# Patient Record
Sex: Female | Born: 1945
Health system: Southern US, Community
[De-identification: ages and names within clinical notes are randomized; demographics above are authoritative.]

## PROBLEM LIST (undated history)

## (undated) DIAGNOSIS — C801 Malignant (primary) neoplasm, unspecified: Secondary | ICD-10-CM

## (undated) DIAGNOSIS — Z923 Personal history of irradiation: Secondary | ICD-10-CM

## (undated) DIAGNOSIS — C50919 Malignant neoplasm of unspecified site of unspecified female breast: Secondary | ICD-10-CM

## (undated) HISTORY — PX: BREAST LUMPECTOMY: SHX2

## (undated) HISTORY — PX: JOINT REPLACEMENT: SHX530

---

## 1989-03-18 DIAGNOSIS — C801 Malignant (primary) neoplasm, unspecified: Secondary | ICD-10-CM

## 1989-03-18 HISTORY — DX: Malignant (primary) neoplasm, unspecified: C80.1

## 2000-07-04 ENCOUNTER — Other Ambulatory Visit: Admission: RE | Admit: 2000-07-04 | Discharge: 2000-07-04 | Payer: Self-pay | Admitting: Neurology

## 2000-07-15 ENCOUNTER — Other Ambulatory Visit: Admission: RE | Admit: 2000-07-15 | Discharge: 2000-07-15 | Payer: Self-pay | Admitting: Neurology

## 2001-06-22 ENCOUNTER — Ambulatory Visit (HOSPITAL_COMMUNITY): Admission: RE | Admit: 2001-06-22 | Discharge: 2001-06-22 | Payer: Self-pay | Admitting: Internal Medicine

## 2001-06-22 ENCOUNTER — Encounter: Payer: Self-pay | Admitting: Internal Medicine

## 2002-02-09 ENCOUNTER — Ambulatory Visit (HOSPITAL_BASED_OUTPATIENT_CLINIC_OR_DEPARTMENT_OTHER): Admission: RE | Admit: 2002-02-09 | Discharge: 2002-02-09 | Payer: Self-pay | Admitting: Orthopedic Surgery

## 2002-06-30 ENCOUNTER — Ambulatory Visit (HOSPITAL_COMMUNITY): Admission: RE | Admit: 2002-06-30 | Discharge: 2002-06-30 | Payer: Self-pay | Admitting: Internal Medicine

## 2002-06-30 ENCOUNTER — Encounter: Payer: Self-pay | Admitting: Internal Medicine

## 2003-07-04 ENCOUNTER — Ambulatory Visit (HOSPITAL_COMMUNITY): Admission: RE | Admit: 2003-07-04 | Discharge: 2003-07-04 | Payer: Self-pay | Admitting: Internal Medicine

## 2004-03-18 HISTORY — PX: FOOT SURGERY: SHX648

## 2004-07-05 ENCOUNTER — Ambulatory Visit (HOSPITAL_COMMUNITY): Admission: RE | Admit: 2004-07-05 | Discharge: 2004-07-05 | Payer: Self-pay | Admitting: Internal Medicine

## 2005-05-10 ENCOUNTER — Ambulatory Visit (HOSPITAL_BASED_OUTPATIENT_CLINIC_OR_DEPARTMENT_OTHER): Admission: RE | Admit: 2005-05-10 | Discharge: 2005-05-10 | Payer: Self-pay | Admitting: Specialist

## 2005-07-08 ENCOUNTER — Ambulatory Visit (HOSPITAL_COMMUNITY): Admission: RE | Admit: 2005-07-08 | Discharge: 2005-07-08 | Payer: Self-pay | Admitting: Internal Medicine

## 2005-10-04 ENCOUNTER — Inpatient Hospital Stay (HOSPITAL_COMMUNITY): Admission: RE | Admit: 2005-10-04 | Discharge: 2005-10-08 | Payer: Self-pay | Admitting: Specialist

## 2006-01-20 ENCOUNTER — Inpatient Hospital Stay (HOSPITAL_COMMUNITY): Admission: RE | Admit: 2006-01-20 | Discharge: 2006-01-24 | Payer: Self-pay | Admitting: Specialist

## 2006-07-14 ENCOUNTER — Ambulatory Visit (HOSPITAL_COMMUNITY): Admission: RE | Admit: 2006-07-14 | Discharge: 2006-07-14 | Payer: Self-pay | Admitting: Internal Medicine

## 2007-07-15 ENCOUNTER — Ambulatory Visit (HOSPITAL_COMMUNITY): Admission: RE | Admit: 2007-07-15 | Discharge: 2007-07-15 | Payer: Self-pay | Admitting: Internal Medicine

## 2008-01-16 ENCOUNTER — Emergency Department (HOSPITAL_COMMUNITY): Admission: AC | Admit: 2008-01-16 | Discharge: 2008-01-16 | Payer: Self-pay | Admitting: Emergency Medicine

## 2008-03-03 ENCOUNTER — Ambulatory Visit (HOSPITAL_COMMUNITY): Admission: RE | Admit: 2008-03-03 | Discharge: 2008-03-03 | Payer: Self-pay | Admitting: Family Medicine

## 2008-07-14 ENCOUNTER — Ambulatory Visit (HOSPITAL_COMMUNITY): Admission: RE | Admit: 2008-07-14 | Discharge: 2008-07-14 | Payer: Self-pay | Admitting: Internal Medicine

## 2009-07-17 ENCOUNTER — Ambulatory Visit (HOSPITAL_COMMUNITY): Admission: RE | Admit: 2009-07-17 | Discharge: 2009-07-17 | Payer: Self-pay | Admitting: Internal Medicine

## 2010-06-25 ENCOUNTER — Other Ambulatory Visit (HOSPITAL_COMMUNITY): Payer: Self-pay | Admitting: Internal Medicine

## 2010-06-25 DIAGNOSIS — Z139 Encounter for screening, unspecified: Secondary | ICD-10-CM

## 2010-07-20 ENCOUNTER — Ambulatory Visit (HOSPITAL_COMMUNITY)
Admission: RE | Admit: 2010-07-20 | Discharge: 2010-07-20 | Disposition: A | Discharge status: Home / Self Care | Payer: Medicare Other | Source: Ambulatory Visit | Attending: Internal Medicine | Admitting: Internal Medicine

## 2010-07-20 DIAGNOSIS — Z1231 Encounter for screening mammogram for malignant neoplasm of breast: Secondary | ICD-10-CM | POA: Insufficient documentation

## 2010-07-20 DIAGNOSIS — Z139 Encounter for screening, unspecified: Secondary | ICD-10-CM

## 2010-08-03 NOTE — H&P (Signed)
Paige, Freeman                 ACCOUNT NO.:  [id number]   MEDICAL RECORD NO.:  [id number]          PATIENT TYPE:  INP   LOCATION:  NA                           FACILITY:  [facility]   PHYSICIAN:  Jene Every, M.D.    DATE OF BIRTH:  11-23-1945   DATE OF ADMISSION:  10/04/2005  DATE OF DISCHARGE:                                HISTORY & PHYSICAL   CHIEF COMPLAINT:  Left knee pain.   HISTORY:  [name] is a pleasant 65 year old female with longstanding  history of bilateral knee pain, left being greater than right.  She has  undergone arthroscopy as well as multiple cortisone injections with minimal  relief of her symptoms.  She does state her pain is fairly disabling.  She  notes pain along the medial joint line as well as pain with patellofemoral  compression.  Radiographs dyspnea on exertion reveal end-stage medial  compartment narrowing bilaterally with patellofemoral arthritis as well.  It  is felt at this point that the patient will benefit from a knee  arthroplasty.  The risks and benefits of the surgery were discussed with the  patient.  She did receive medical clearance.  Her surgery was scheduled.   CURRENT MEDICATIONS:  1.  Hydrochlorothiazide 25 mg half p.o. daily.  2.  Lipitor 10 mg one p.o. daily.  3.  Prilosec p.r.n.  4.  Lodine 400 mg one p.o. b.i.d.   ALLERGIES:  NO KNOWN DRUG ALLERGIES.   PAST MEDICAL HISTORY:  1.  Hypertension.  2.  Hypercholesterolemia.  3.  Gastroesophageal reflux disease.   PAST SURGICAL HISTORY:  1.  Previous lumpectomy for breast cancer on the left in 1991.  2.  Right foot surgery in 2003.  3.  Right knee arthroscopy in 2007.   SOCIAL HISTORY:  Patient is married.  She is a housewife.  She denies any  tobacco or alcohol consumption.  Husband will be her primary caregiver.  She  lives in a [place] home.  Her primary care physician is Dr. Sherwood Gambler.   FAMILY HISTORY:  Noncontributory.   REVIEW OF SYSTEMS:  GENERAL:  The patient  denies any fevers, chills, night  sweats or bleeding tendencies.  CNS:  No blurry, double vision, seizure,  headache or paralysis.  RESPIRATORY:  No shortness of breath, productive  cough or hemoptysis.  CARDIOVASCULAR:  No chest pain, dyspnea or orthopnea.  GU:  No dysuria, hematuria or discharge.  GI:  No nausea, vomiting,  diarrhea, constipation, melena or bloody stools.  MUSCULOSKELETAL:  Pertinent as in HPI.   PHYSICAL EXAMINATION:  GENERAL APPEARANCE:  This is a well-developed, well-  nourished female sitting upright in no acute distress.  VITAL SIGNS:  Pulses 110, respiratory 20, blood pressure 122/90.  HEENT:  Atraumatic and normocephalic.  Pupils are equal, round and reactive  to light.  EOM intact.  NECK:  Supple with no lymphadenopathy.  CHEST:  Clear to auscultation bilaterally, no wheezing, rhonchi or rales.  CARDIOVASCULAR:  Patient does have tachycardia but is regular.  No murmurs,  rubs, or gallops are noted.  ABDOMEN:  Soft  and nontender, nondistended, bowel sounds x4.  SKIN:  No rashes or lesions are noted.  EXTREMITIES:  Regarding the left knee, patient does have mild effusion  noted.  She is tender along the medial joint line.  There is pain with  patellofemoral compression.  Calf soft and nontender with no evidence of  DVT.   IMPRESSION:  Degenerative joint disease left knee.   PLAN:  Patient will be admitted to [facility] to undergo a left  total knee arthroplasty.      Roma Schanz, P.A.      Jene Every, M.D.  Electronically Signed    CS/MEDQ  D:  10/03/2005  T:  10/03/2005  Job:  [id number]

## 2010-08-03 NOTE — Discharge Summary (Signed)
[name], [name]                 ACCOUNT NO.:  [id number]   MEDICAL RECORD NO.:  [id number]          PATIENT TYPE:  INP   LOCATION:  1504                         FACILITY:  [facility]   PHYSICIAN:  Jene Every, M.D.    DATE OF BIRTH:  Feb 27, 1946   DATE OF ADMISSION:  10/04/2005  DATE OF DISCHARGE:  10/08/2005                                 DISCHARGE SUMMARY   ADMISSION DIAGNOSES:  1. Degenerative joint disease, left knee.  2. Hypertension.  3. Hypercholesterolemia.  4. Gastroesophageal reflux disease.   DISCHARGE DIAGNOSES:  1. Degenerative joint disease, left knee.  2. Hypertension.  3. Hypercholesterolemia.  4. Gastroesophageal reflux disease.  5. Status post left total knee arthroplasty.  6. Resolved hypokalemia.   HISTORY:  Ms. Parrillo is a pleasant 65 year old female with a longstanding  history of disabling knee pain.  She has undergone arthroscopic debridement  as well as multiple cortisone injections with no relief of her symptoms.  She describes her pain pattern as being fairly disabling.  She has  significant tearing along the medial joint line as well as patellofemoral  compression.  Radiographs do reveal end stage medial compartment narrowing  with patellofemoral changes noted as well.  It was felt at this point that  the patient would benefit from a total knee arthroplasty.  Risks and  benefits were discussed and she did wish to proceed.   CONSULTATIONS:  Physical therapy, Occupational therapy, Case Management.   PROCEDURE:  The patient was taken to the operating room on October 04, 2005 to  undergo a left total knee arthroplasty.  Surgeon was Jene Every, M.D. and  assistant was Roma Schanz, P.A.  Anesthesia was general.  Complications none.   LABORATORY DATA:  Preoperative CBC showed a white blood cell count  approximately 2, hemoglobin 13.8, hematocrit 39.9.  These were followed  throughout the hospital course.  White blood cell count became elevated  on  postoperative day #2 at 12.0.  At time of discharge resolved to 5.9.  Hemoglobin dropped a low of 9.6, hematocrit 27.8, had normalized at time of  discharge to 9.8 and 27.9.  Coagulation studies done preoperatively showed a  PT of 12.5, INR 0.9, PTT of 24.  Patient was placed on Coumadin  postoperatively.  These levels were continued to be followed.  At time of  discharge she was therapeutic on her Coumadin with an INR of 2.6.  Routine  chemistry studies done preoperatively showed sodium 140, potassium 3.8,  elevated glucose 103.  These were followed throughout the hospital course.  The patient's sodium remained normal.  At time of discharge 137.  Potassium  did drop to a low of 3.1, p.o. supplement was implemented.  At time of  discharge potassium was 3.4.  Preop urinalysis showed small leukocyte  esterase, with 0 to 2 white blood cells seen per high power field.  Blood  type B positive.  Chest x-ray shows no active cardiopulmonary disease.  I do  not see a EKG on the chart.   HOSPITAL COURSE:  The patient was admitted and taken to the operating  room  and underwent the above stated procedure without difficulty.  She was then  transferred to the PACU and then to the orthopedic floor for postoperative  care.  Intraoperatively, one Hemovac drain was placed.  Postoperatively the  patient was placed on Coumadin for deep venous thrombosis prophylaxis,  placed on PCA analgesics.  Patient did fairly well postoperatively.  Postoperative day 1 Hemovac was removed without difficulty.  Day #2 dressing  was changed.  Incision was clean, dry and intact.  Compartments were soft.  Coumadin was continued per pharmacy.  Postoperative day #3 patient's INR was  slightly high.  Coumadin was held.  She had a slightly decreased potassium.  This was supplemented with p.o. K-Dur.  Discharge planning was initiated.  Postoperative day #4 it was felt at this point that the patient was stable  to be discharged  home.  Potassium has improved to a level of 3.4.  Coumadin  was restarted.  While she was advancing with physical therapy, home health  physical therapy, occupational therapy had been arranged.   DISPOSITION:  Patient stable to be discharged home with home health physical  therapy and occupational therapy.   PLAN:  Patient to follow up with Dr. Shelle Iron in approximately 10 to 14 days  for staple removal.   ACTIVITY:  She can ambulate as tolerated.  Continue to use knee immobilizer  until she can straight leg raise x 10.   WOUND CARE:  Change dressing daily.  Advise [place] if any erythema or drainage  develop.  Okay for her to shower.   DIET:  Is as tolerated.   DISCHARGE MEDICATIONS:  Resume all home medications.  Vicodin 1 to 2 by  mouth every 4 to 6 hours as needed for pain.  Coumadin as dosed per the  pharmacy.   CONDITION ON DISCHARGE:  Stable.   FINAL DIAGNOSIS:  Doing well status post left total knee arthroplasty.      Roma Schanz, P.A.      Jene Every, M.D.  Electronically Signed    CS/MEDQ  D:  11/06/2005  T:  11/06/2005  Job:  [id number]

## 2010-08-03 NOTE — Op Note (Signed)
[name], [name]                             ACCOUNT NO.:  [id number]   MEDICAL RECORD NO.:  [id number]                   PATIENT TYPE:  AMB   LOCATION:  DSC                                  FACILITY:  [facility]   PHYSICIAN:  Leonides Grills, M.D.                  DATE OF BIRTH:  11/14/1945   DATE OF PROCEDURE:  02/09/2002  DATE OF DISCHARGE:                                 OPERATIVE REPORT   PREOPERATIVE DIAGNOSES:  1. Right second tarsometatarsal arthritis.  2. Right anterolateral ankle soft tissue lesion, deep.   POSTOPERATIVE DIAGNOSES:  1. Right second tarsometatarsal arthritis.  2. Right anterolateral ankle soft tissue lesion, deep.   PROCEDURE:  1. Right second TMT fusion; right local bone graft.  2. Excision right anterolateral ankle deep soft tissue lesion.   ANESTHESIA:  General endotracheal tube.   SURGEON:  Leonides Grills, M.D.   ASSISTANT:  Lianne Cure, P.A.   ESTIMATED BLOOD LOSS:  Minimal.   TOURNIQUET TIME:  One hour.   COMPLICATIONS:  None.   DISPOSITION:  Stable to the PR.   FINDINGS:  Soft tissue lesion that was consistent with ganglion cyst.   INDICATIONS FOR PROCEDURE:  This is a 65 year old female who has had  longstanding dorsal pain over the second TMT area with a spur as well as an  intermittently growing cyst on the anterolateral aspect of the ankle.  MRI  was consistent with a ganglion cyst was well as isolated arthritis of the  second tarsal metatarsal joint.  She was consented for the above procedure.  All risks which include infection, nerve vessel injury, nonunion, malunion,  hardware failure, recurrence of the lesion anterolaterally about the ankle,  stiffness, and arthritis were all explained.  Questions were encouraged and  answered.   DESCRIPTION OF PROCEDURE:  The patient was brought to the operating room and  placed in the supine position.  After adequate general endotracheal tube  anesthesia was administered as well as the Ancef  1 gram IV piggyback.  The  right lower extremity was prepped and draped in the usual sterile fashion  with a proximally placed thigh tourniquet, we started the procedure with a  longitudinal incision over the anterolateral aspect of the ankle.  Dissection was carried down to the soft tissue lesion.  This was well  encapsulated and was dissected out to its stalk which was traced to the  sinus tarsi area.  Once this was resected, the wound was copiously irrigated  with normal saline. The subcu was closed with 3-0 Vicryl.  The skin was  closed with 4-0 nylon. We then made a longitudinal incision lateral to the  EHL tendon, dissection was carried down through subcu.  Dorsalis pedis was  identified and protected laterally. Throughout the remaining portion of the  procedure.  The second tarsal metatarsal joint was then identified both  visually and under C-arm  guidance.  This was severely arthritic and had a  large dorsal spur which was excised with a curved 1/4 inch osteotome.  There  was also cystic fluid in this area as well which was removed. With the aid a  laminar spreader, the remaining portion of the cartilage was then removed.  Multiple 2 mm drill holes were then placed on either side of the joint for  iatrogenic vascular channels.  We then obtaining local bone graft and placed  this within the defect dorsally.  We then reduced this with a two-point  reduction clamp.  Once this was held compressed, we then placed a Lis-franc  directed lag screw across this area to maintain the reduction and  compression.  Stress spring relieving local bone graft was then placed in  the dorsal aspect of the wound.  Screw size was a 32 mm fully threaded  cortical lag screw placed in the standard AO technique.  This was done  through a stab wound medially.  The wound was copiously irrigated with  normal saline.  Final x-rays in the AP, lateral, and oblique planes were  obtained and show excellent alignment  and compression across the site.  Tourniquet was deflated with palpable dorsalis pedis pulse.  The wounds were  closed with 4-0 nylon.  Sterile dressing was applied.  Modified Jones  dressing was applied to the ankle in neutral dorsiflexion. The patient was  stable to the PR.                                               Leonides Grills, M.D.    PB/MEDQ  D:  02/09/2002  T:  02/09/2002  Job:  [id number]

## 2010-08-03 NOTE — Op Note (Signed)
NAMESKYLAR, [name]                 ACCOUNT NO.:  [id number]   MEDICAL RECORD NO.:  [id number]          PATIENT TYPE:  AMB   LOCATION:  NESC                         FACILITY:  [facility]   PHYSICIAN:  Jene Every, M.D.    DATE OF BIRTH:  Jun 26, 1945   DATE OF PROCEDURE:  05/10/2005  DATE OF DISCHARGE:                                 OPERATIVE REPORT   PREOPERATIVE DIAGNOSIS:  Medial meniscal tear, degenerative joint disease,  right knee.   POSTOPERATIVE DIAGNOSIS:  Grade 3 chondromalacia of the patella, grade 4  chondromalacia and grade 3 chondromalacia, medial compartment, medial  meniscal tear, lateral meniscal tear.   PROCEDURE PERFORMED:  Right knee arthroscopy, chondroplasty of the patella,  medial femoral condyle, posterior medial meniscectomy, lateral meniscectomy.   BRIEF HISTORY/INDICATIONS:  A 65 year old with refractory knee pain.  MRI  and x-ray indicating degenerative changes, locking giving way.  Felt to have  a degenerative meniscal tear as well as DJD.  Operative intervention was  indicated for debridement and partial meniscectomy.  Discussed risks and  benefits, including bleeding, infection, damage to vascular structures, DVT,  PE, anesthetic complications, no changes in symptoms, worsening symptoms,  the need for total knee arthroplasty in the future, etc.   TECHNIQUE:  With the patient in a supine position, after the induction of  adequate general anesthesia and 1 gm of Kefzol, the right lower extremity  was prepped and draped in the usual sterile fashion.  A lateral parapatellar  portal and a superomedial parapatellar portal was fashioned with a #11  blade.  Ingress cannula atraumatically placed.  The irrigant was utilized to  insufflate the joint.  The camera was then inserted.  Under direct  visualization, a medial parapatellar portal was fashioned with a #11 blade  after localization with an 18 gauge needle, sparing the medial meniscus.  A  shaver was  introduced and utilized to perform a chondroplasty of the  patella, where grade 3 chondromalacia was noted.  There was normal  patellofemoral tracking.  The medial compartment revealed some grade 4  changes, moderate, over the medial femoral condyles and mild in the tibial  plateau.  There was a complex tear of the posterior third of the meniscus  extending halfway posteriorly.  The basket rongeur was utilized to perform a  posterior medial meniscectomy with a stable base.  Further contoured with a  4.2 coude shaver.  ACL and PCL were unremarkable.  The lateral meniscus  demonstrated a radial tear.  This was excised with a basket rongeur and  contoured to a stable base with 4.2 coude shaver.  Both remaining meniscal  remnants were stable to probe palpation without evidence of pathologic  tears.  Chondroplasty of the femoral condyle with grade 3 changes were  performed.  We evacuated loose bodies.  The gutters were unremarkable.  Good  range of motion.  Fully lavaged of the knee.  Reinspected all compartments.  No loose cartilaginous debris was noted.  Palpated posteriorly on the  popliteal area.  Expressed no free cartilage.  The knee was copiously  lavaged.  All instrumentation  was removed.  Portals were  closed with 4-0 nylon simple sutures, and 0.25% Marcaine with epinephrine  was infiltrated in the joint.  The wound was dressed sterilely.  She was  awakened without difficulty and transported to the recovery room in  satisfactory condition.  The patient tolerated the procedure well with no  complication.      Jene Every, M.D.  Electronically Signed     JB/MEDQ  D:  05/10/2005  T:  05/11/2005  Job:  [id number]

## 2010-08-03 NOTE — Op Note (Signed)
[name], [name]                 ACCOUNT NO.:  [id number]   MEDICAL RECORD NO.:  [id number]          PATIENT TYPE:  INP   LOCATION:  1504                         FAC[facility]   PHYSICIAN:  Jene Every, M.D.    DATE OF BIRTH:  06/03/1945   DATE OF PROCEDURE:  10/04/2005  DATE OF DISCHARGE:                                 OPERATIVE REPORT   PREOPERATIVE DIAGNOSIS:  Degenerative joint disease, left knee.   POSTOPERATIVE DIAGNOSIS:  Degenerative joint disease, left knee.   PROCEDURE:  Left total knee arthroplasty.   ANESTHESIA:  General.   ASSISTANT:  Roma Schanz, P.A.   COMPONENTS:  DePuy rotating platform, 3 femur, 3 tibia, 10 mm insert, 35  dome patella.   BRIEF HISTORY:  A 65 year old with end-stage osteoarthrosis of the knee  particularly the medial compartment refractory to conservative treatment.  Operative intervention is indicated for replacement of degenerated joint.  The risks and benefits were discussed including bleeding, infection, injury  to neurovascular structures, suboptimal range of motion, component failure,  DVT, PE, anesthetic complications, infection, need for revision, etc.   TECHNIQUE:  The patient in the supine position and after the induction of  adequate general anesthesia and 1 gram of Kefzol, the left lower extremity  was prepped and draped and exsanguinated in the usual sterile fashion and  thigh tourniquet was inflated to 300 mmHg. A midline incision was made in  the skin, subcutaneous tissue was dissected, electrocautery was utilized to  achieve hemostasis. A median parapatellar arthrotomy was performed, patella  everted, knee flexed, patellofemoral ligament was divided, copious clear  synovial fluid was evacuated. Subperiosteally elevated, medial aspect of the  superficial portion of the medial collateral ligament. I removed osteophytes  with a rongeur, there was tricompartmental osteoarthrosis. A step drill was  utilized to enter  the femoral canal. Over reamed, irrigated and  intramedullary guide 5 degree left was then placed, pinned. 10 mm off the  distal femur was a cut with soft tissues well-protected. The femur was sized  to a 3, distal femoral cutting jig was then applied, the anterior cortex was  measured with a 3 being the size. The jig was pinned in slight external  rotation 3 degrees. The jig was then placed, anterior, posterior and chamfer  cuts performed with an oscillating saw. The posterior elements and soft  tissue protected at all times. Attention next turned towards the tibia. The  tibial spine was then removed, measured a 3 plate. We placed the external  jig on the tibia, __________ were placed posteriorly and laterally. This was  very carefully. External alignment jig parallel to the tibia for a 90 degree  zero cut. 10 mm off the high side which was lateral, this was then pinned  just medial to the tibial tubercle with adequate coverage of the tibia. We  then used the oscillating saw to perform the tibial cut. This was excellent,  the soft tissue was well protected. I then removed this jig, placed him in  extension and checked the flexion extension gap with a 10 mm block guide  which was excellent in extension. We removed the shin for flexion. Good  stability was noted. The wound was copiously irrigated, examined posteriorly  and removed osteophytes. Next, the knee was then flexed. We placed a distal  box jig to the femur, clamped it. We used an oscillating saw to fashion the  box cut. We then placed a trial 3 femur. The baseplate was then placed  through the tibia, a step reamer applied and then we used a punch guide to  the tibia, placed a trial tibia with an insert, reduced the femur to the  tibia, found to be in full extension with perhaps slight hyperextension.  Full flexion 90 degrees without liftoff and full flexion to 130. No evidence  of __________  0-30 degrees. I felt this was the  appropriate sizing with a  10 mm insert. All instrumentation was then removed, the patella was everted,  osteophytes removed with the rongeur. We used a patella planer with at least  12-13 mm of patella thickness. We used a planer and then the peg guide sized  to a 35. Next, the pulsatile lavage was then delivered into the wound. The  cement was mixed on the posterior table in appropriate fashion. After  irrigation and drying of the wound, the tibia was flexed, subluxed, dried,  cement placed in the proximal tibia. With the trials in, we had trialed it  to a 10 mm insert. The tibial component was impacted, redundant cement was  removed. The femur was then impacted, redundant cement removed. The trial 10  was placed, the knee was reduced, held in extension with an axial load  applied, redundant cement removed. This was until the appropriate cement  curing. The patella button was then cemented as well with the patella clamp.  After appropriately curing the cement, the insert was removed as were the  clamps. We inspected the joint and removed redundant cement. We copiously  irrigated. We cauterized the geniculates. Again the 10 was found to be  appropriate at full extension, full flexion, negative anterior drawer with  towel clips on the patella ligament. No instability. We selected the 10 and  placed the 10 insert after copious irrigation, again full flexion, full  extension, no liftoff, good patellofemoral tracking. No need for lateral  retinacular release. Again the wound copiously irrigated. Inspection  revealed no evidence of loose cement. The knee was placed in 30 degrees of  flexion, repaired the patella after placing a Hemovac, bringing it out  through a lateral stab wound in the skin. We repaired the patella ligament  with #1 Vicryl interrupted figure-of-eight sutures. Subcutaneous tissue  reapproximated with 2-0 Vicryl simple suture. The skin was reapproximated with staples. We then  placed 4% Marcaine with epinephrine into the joint.  The wound was then dressed sterilely just prior to that, full extension and  flexion holding the thigh to at least 110 degrees.   Next, a sterile dressing applied. The tourniquet was deflated and there was  adequate vascularization of the lower extremity.   The patient tolerated the procedure well with no complications. Tourniquet  time was an hour and a half. Assistant Roma Schanz.      Jene Every, M.D.  Electronically Signed     JB/MEDQ  D:  10/04/2005  T:  10/04/2005  Job:  [id number]

## 2010-08-03 NOTE — H&P (Signed)
[name], [name]                 ACCOUNT NO.:  [id number]   MEDICAL RECORD NO.:  [id number]          PATIENT TYPE:  INP   LOCATION:  NA                           FACILITY:  [facility]   PHYSICIAN:  Jene Every, M.D.    DATE OF BIRTH:  [birth year]   DATE OF ADMISSION:  01/13/2006  DATE OF DISCHARGE:                                HISTORY & PHYSICAL   CHIEF COMPLAINT:  Right knee pain.   HISTORY:  [name] is a pleasant 65 year old female with longstanding  history of bilateral knee pain.  She has recently undergone a left total  knee replacement and has done quite well from this.  She is now ready to  proceed with a right total knee.  Her pain on this side is fairly disabling.  Radiographs do show end-stage medial compartment narrowing as well as  patellar femoral arthritis.  It is felt she would benefit from a total knee  on the right.  The risks and benefits of this surgery were discussed with  the patient.  She elected to proceed.   MEDICAL HISTORY:  1. Significant for hypertension.  2. Hypercholesterolemia.  3. Gastroesophageal reflux disease.   CURRENT MEDICATIONS:  1. HCTZ 25 mg 1 p.o. daily.  2. Lipitor 10 mg 1 p.o. daily.  3. Prilosec p.r.n.  4. Lodine 400 mg 1 p.o. b.i.d.  5. Robaxin 1 p.o. b.i.d. p.r.n.   ALLERGIES:  None.   PAST SURGICAL HISTORY:  1. Include previous lumpectomy for breast cancer on the left in 1991.  2. Right foot surgery in 2003.  3. Right knee arthroscopy in 2007.  4. Left total knee 2007.   SOCIAL HISTORY:  The patient is married.  She is a housewife.  She denies  any tobacco or alcohol consumption.  Husband will be her primary caregiver.  She lives in a one story home.  Her primary care physician is Dr. Sherwood Gambler.   FAMILY HISTORY:  Noncontributory.   REVIEW OF SYSTEMS:  GENERAL:  The patient denies any fever, chills, night  sweats or bleeding tendency.  CNS:  No blurred or double vision, seizure,  headaches or paralysis.  RESPIRATORY:  No  shortness of breath, productive  cough or hemoptysis.  CARDIOVASCULAR:  No chest pain, angina or orthopnea.  GU:  No dysuria, hematuria or discharge.  GI:  No nausea, vomiting,  diarrhea, constipation, melena or bloody stools.  MUSCULOSKELETAL:  Pertinent as per HPI.   PHYSICAL EXAMINATION:  A well-developed, well-nourished female seen upright  in no acute distress.  She did develop a slight antalgic gait.  BP is  134/100 right arm.  The patient did not take her morning medications.  Pulse  is 100, respiratory rate 20.  HEENT:  Normocephalic and atraumatic.  Pupils are equal, round, and reactive  to light.  EOMs intact.  NECK:  Supple.  No lymphadenopathy.  CHEST:  Clear to auscultation bilaterally.  No rhonchi, wheezes or rales.  No murmurs, gallops, or rubs.  ABDOMEN:  Soft, nontender, and nondistended.  Bowel sounds are full.  SKIN:  No rashes or lesions  are noted.  EXTREMITIES:  The patient does have mild effusion noted on the right.  She  is exquisitely tender along the medial joint line.  There is pain with  compression.  Calf soft and nontender.   IMPRESSION:  Degenerative joint disease right knee.   PLAN:  The patient will be admitted to [facility] to undergo a  right total knee arthroplasty.      Roma Schanz, P.A.      Jene Every, M.D.  Electronically Signed    CS/MEDQ  D:  01/13/2006  T:  01/13/2006  Job:  [id number]

## 2010-08-03 NOTE — Discharge Summary (Signed)
[name], [name]                 ACCOUNT NO.:  [id number]   MEDICAL RECORD NO.:  [id number]          PATIENT TYPE:  INP   LOCATION:  1519                         FACILITY:  [facility]   PHYSICIAN:  Jene Every, M.D.    DATE OF BIRTH:  04-Jan-1946   DATE OF ADMISSION:  01/20/2006  DATE OF DISCHARGE:  01/24/2006                               DISCHARGE SUMMARY   ADMISSION DIAGNOSES:  1. Degenerative joint disease right knee.  2. Hypertension.  3. Hypercholesterolemia.  4. Gastroesophageal reflux disease.   DISCHARGE DIAGNOSES:  1. Degenerative joint disease right knee.  2. Hypertension.  3. Hypercholesterolemia.  4. Gastroesophageal reflux disease.  5. Status post right total knee arthroplasty.   CONSULTATIONS:  1. Physical therapy.  2. Occupational therapy.  3. Case management.   PROCEDURE:  The patient was taken to the OR on January 20, 2006 to  undergo right total knee arthroplasty.   SURGEON:  Jene Every, M.D.   ASSISTANT:  Roma Schanz, [facility]   ANESTHESIA:  General.   COMPLICATIONS:  None.   HISTORY OF PRESENT ILLNESS:  [name] is a pleasant 65 year old female  with a long standing history of bilateral knee pain. She has undergone a  left total knee arthroplasty back in the spring with excellent relief of  her symptoms. She has failed conservative treatment with the right, so  it was felt that she would benefit from a total knee on the right as  well. The risks and benefits were discussed with the patient and she  does elect to proceed.   LABORATORY DATA:  Preoperative CBC showed a white cell count of 5.8.  Hemoglobin and hematocrit 14.3 and 41.2. These were followed throughout  the hospital course. White cell count remained normal. White count at  time of discharge, 4.7. Hemoglobin did drop to a low of 9.6. Hematocrit  27.2. However, the patient remained asymptomatic. Coagulation studies  done preoperatively showed a PT of 12.1, INR of 0.9. These were  monitored throughout the hospital course, secondary to placing her on  Coumadin. At time of discharge, she was placed therapeutic on her INR to  a low of 1.9. Routine chemistries done preoperatively showed sodium of  140, potassium 3.2, glucose of 109 with a normal BUN and creatinine. The  patient did have a slight drop in her sodium to a low of 133, however,  this corrected itself at the time of discharge to a low of 140.  Potassium did improve to a low of 3.4. Glucose stabilized at 97 with  continued normal BUN and creatinine. Pre-admission urinalysis was  positive for mild leukocyte esterase. There were 11 to 20 WBC's noted  per high powered field. This was repeated at time of admission with  clearing of her urine. Blood type was B positive. Urine culture obtained  at admission showed no growth.   EKG and chest x-ray were used from her previous admission back in June,  which showed no abnormalities.   HOSPITAL COURSE:  The patient was admitted, taken to the OR, and  underwent the above stated procedure without difficulty.  One Hemovac  drain was placed intra-operatively. She was then transferred to the  PACU. Placed on PC analgesics. Coumadin was begun. She was then  transferred to the orthopedic floor with continued postoperative care.  On postoperative day 1, the patient was doing fairly well. She did have  multiple complaints about the nursing care. Stated that she was not  sleeping well. She complained of some indigestion. Vital signs remained  stable. She was afebrile. There was minimal bloody drainage on the  dressing. Hemovac was draining without difficulty. Motor neurovascular  function was intact. We did adjust her medications. Accordingly also  adjusted her fluids since there was mild hyponatremia. PCA was  discontinued by postoperative day 2. On postoperative day 2, the drain  was pulled with tip intact. Dressing was changed and the incision was  clean and dry without  evidence of infection. Calves soft and non-tender.  The patient continued to advance with physical therapy. Coumadin was  continued per the pharmacy. On postoperative day 4, the patient  continued to do very well. She was voiding and passing flatus without  difficulty. She was advancing well with therapy. Discharge planning was  initiated on postoperative day 4. It was felt at this point that the  patient was stable to be discharged to home. Vital signs were stable.  Hemoglobin and hematocrit 9.6 and 27.2. INR of 1.9.   DISPOSITION:  The patient is stable to be discharged to home.   FOLLOWUP:  She will followup with Dr. Shelle Iron in approximately 10 to 14  from surgery for suture removal and x-ray.   DIET:  As tolerated.   ACTIVITY:  The patient is to walk utilizing a walker. She can be full  weight bearing. Utilize the knee immobilizer until she can straight leg  raise to 10. Total knee precautions were discussed.   WOUND CARE:  To change her dressing daily. To keep this area clean and  dry. Continue with ice and elevation 6 times a day for 20 minutes at a  time.   DISCHARGE MEDICATIONS:  1. Include all home medications as well as Coumadin as dosed per the      pharmacy.  2. Vicodin 1 to 2 p.o. q.4 to 6 p.r.n. pain  3. Robaxin 500 mg 1 p.o. q.8 p.r.n. spasms.  4. Vitamin C 500 mg daily.  5. Multivitamin with iron for postoperative anemia.   CONDITION ON DISCHARGE:  Stable.   FINAL DIAGNOSIS:  Doing well status post right total knee arthroplasty.      Roma Schanz, P.A.      Jene Every, M.D.  Electronically Signed    CS/MEDQ  D:  02/13/2006  T:  02/13/2006  Job:  [id number]

## 2010-08-03 NOTE — Op Note (Signed)
NAMEMARIPAZ, [name] NO.:  [id number]   MEDICAL RECORD NO.:  [id number]          PATIENT TYPE:  INP   LOCATION:  1519                         FACILITY:  [facility]   PHYSICIAN:  Jene Every, M.D.    DATE OF BIRTH:  [birth year]   DATE OF PROCEDURE:  01/20/2006  DATE OF DISCHARGE:                                 OPERATIVE REPORT   PREOPERATIVE DIAGNOSIS:  Degenerative joint disease, right knee.   POSTOPERATIVE DIAGNOSIS:  Degenerative joint disease, right knee.   OPERATION PERFORMED:  Right total knee arthroplasty.   SURGEON:  Jene Every, M.D.   ASSISTANT:  Roma Schanz, P.A.   ANESTHESIA:  General.   COMPONENTS USED:  DePuy rotating platform, 3 femur, 3 tibia, 10 mm insert,  28 patella.   INDICATIONS FOR PROCEDURE:  65 year old end-stage __________ medial  compartment refractory to conservative treatment.  Operative intervention is  indicated for replacement of the degenerated joint. Risks and benefits  discussed including bleeding, infection, __________ suboptimal range of  motion, need for revision, anesthetic complications, etc.   DESCRIPTION OF PROCEDURE:  Patient supine position.  After induction of  adequate general anesthesia, 2 g Kefzol, the right lower extremity was  prepped and draped and exsanguinated in the usual sterile fashion.  Thigh  tourniquet inflated to 325 mmHg.  Anterior midline incision was made through  the skin, subcutaneous tissue was dissected sharply.  Median parapatellar  arthrotomy performed, copious normal synovial fluid evacuated, patella  everted, knee flexed.  Tricompartmental osteoarthrosis was noted.  Osteophytes were removed with a rongeur.  ACL remnant removed.  Step drill  used to enter the femur, irrigated,  T-handle reamer then inserted.  5  degree left was then placed for the distal femoral cut at 10 mm.  There was  no contracture.  This was pinned oscillating saw was utilized to perform  this cut.  Soft  tissues well protected.  Distal femoral sizing jig applied.  It was sized to a 3, pinned in slight external rotation.  The anterior,  posterior and chamfer cuts were performed protecting the soft tissue.  I  then performed a box cut as well utilizing the appropriate technique,  protecting the soft tissues posteriorly.  Next, we subluxed the tibia and  removed the remnant of the medial and lateral meniscus.  Sized the tibia to  a 3.  External alignment jig was then placed with the proximal guide 10 mm  taken off the high side which was lateral just medial to the tubercle  anterior to the tibia bisecting the ankle.  This was then pinned.  Oscillating saw was used to perform the tibial cut.  We then checked flexion  and extension gaps with the block and they were identical with good  stability.  Next, we pinned the base plate in appropriate coverage, slight  external rotation just medial to the tibial tubercle.  We used the punch  guide.  Then we placed a trial tibia, femur, the button and 10 mm insert,  reduced it in full extension, full flexion, no lift off, good stability with  varus, valgus stressing in 0 and 30 degrees.  Patella was sized to a small,  measured and clamped.  We used the patella planer with residual 16 mm of  patella remaining.  The three peg holes were then drilled with medialization  of the patellar button.  It was trialed and found to be satisfactory.  A  small medial retinacular release was then performed.  Next, all  instrumentation was removed.  We checked posteriorly, no osteophytes.  Used  pulsatile lavage cleaning the bony surfaces.  Mixed the cement in the  appropriate fashion.  Knee was flexed, bony surfaces dried, checked,  cemented the tibia, cemented the tibial component, impacted it and removed  redundant cement and did so with the femur as well with cement on the  runners as well as the patella and this was clamped.  All redundant cement  was removed.  We  placed a 10 mm insert, reduced the knee and held it in  extension with an axial load applied.  After the appropriate curing of the  cement, we removed the trial, redundant cement removed.  Just prior to that  had full extension, full flexion, good stability, felt the 10 mm insert was  appropriate.  After removing redundant cement and copious irrigation, we  cauterized the geniculates, placing a 10 mm insert in full extension, full  flexion, no lift off, good tracking of the patella.  Also placed in 30 mL of  0.25% Marcaine with epinephrine.  Next we placed a Hemovac and brought out  through a lateral stab wound in the skin, repaired the patella arthrotomy  with #1 Vicryl interrupted figure-of-eight sutures and subcutaneous tissue  reapproximated with 0 and 2-0 Vicryl simple sutures.  Skin was  reapproximated with staples and sterile dressed, just prior to that had full  flexion, full extension following the closure.  There was adequate  revascularization lower extremity appreciated.  An hour and 10 minutes on  tourniquet.      Jene Every, M.D.  Electronically Signed     JB/MEDQ  D:  01/20/2006  T:  01/21/2006  Job:  [id number]

## 2011-06-04 ENCOUNTER — Other Ambulatory Visit (HOSPITAL_COMMUNITY): Payer: Self-pay | Admitting: Internal Medicine

## 2011-06-04 DIAGNOSIS — Z139 Encounter for screening, unspecified: Secondary | ICD-10-CM

## 2011-06-07 ENCOUNTER — Other Ambulatory Visit (HOSPITAL_COMMUNITY): Payer: Private Health Insurance - Indemnity

## 2011-06-27 ENCOUNTER — Other Ambulatory Visit (HOSPITAL_COMMUNITY): Payer: Self-pay | Admitting: Internal Medicine

## 2011-06-27 DIAGNOSIS — Z139 Encounter for screening, unspecified: Secondary | ICD-10-CM

## 2011-07-22 ENCOUNTER — Ambulatory Visit (HOSPITAL_COMMUNITY)
Admission: RE | Admit: 2011-07-22 | Discharge: 2011-07-22 | Disposition: A | Discharge status: Home / Self Care | Payer: Medicare Other | Source: Ambulatory Visit | Attending: Internal Medicine | Admitting: Internal Medicine

## 2011-07-22 DIAGNOSIS — Z139 Encounter for screening, unspecified: Secondary | ICD-10-CM

## 2011-07-22 DIAGNOSIS — Z1231 Encounter for screening mammogram for malignant neoplasm of breast: Secondary | ICD-10-CM | POA: Insufficient documentation

## 2012-12-04 ENCOUNTER — Other Ambulatory Visit (HOSPITAL_COMMUNITY): Payer: Self-pay | Admitting: Internal Medicine

## 2012-12-04 DIAGNOSIS — Z139 Encounter for screening, unspecified: Secondary | ICD-10-CM

## 2012-12-22 ENCOUNTER — Ambulatory Visit (HOSPITAL_COMMUNITY)
Admission: RE | Admit: 2012-12-22 | Discharge: 2012-12-22 | Disposition: A | Discharge status: Home / Self Care | Payer: Medicare Other | Source: Ambulatory Visit | Attending: Internal Medicine | Admitting: Internal Medicine

## 2012-12-22 DIAGNOSIS — Z139 Encounter for screening, unspecified: Secondary | ICD-10-CM

## 2012-12-22 DIAGNOSIS — Z1231 Encounter for screening mammogram for malignant neoplasm of breast: Secondary | ICD-10-CM | POA: Insufficient documentation

## 2014-04-26 ENCOUNTER — Other Ambulatory Visit (HOSPITAL_COMMUNITY): Payer: Self-pay | Admitting: Internal Medicine

## 2014-04-26 DIAGNOSIS — Z1231 Encounter for screening mammogram for malignant neoplasm of breast: Secondary | ICD-10-CM

## 2014-05-23 ENCOUNTER — Ambulatory Visit (HOSPITAL_COMMUNITY)
Admission: RE | Admit: 2014-05-23 | Discharge: 2014-05-23 | Disposition: A | Discharge status: Home / Self Care | Payer: Medicare HMO | Source: Ambulatory Visit | Attending: Internal Medicine | Admitting: Internal Medicine

## 2014-05-23 DIAGNOSIS — Z1231 Encounter for screening mammogram for malignant neoplasm of breast: Secondary | ICD-10-CM | POA: Diagnosis present

## 2015-10-17 ENCOUNTER — Other Ambulatory Visit (HOSPITAL_COMMUNITY): Payer: Self-pay | Admitting: Internal Medicine

## 2015-10-17 DIAGNOSIS — Z1231 Encounter for screening mammogram for malignant neoplasm of breast: Secondary | ICD-10-CM

## 2015-10-25 ENCOUNTER — Ambulatory Visit (HOSPITAL_COMMUNITY)
Admission: RE | Admit: 2015-10-25 | Discharge: 2015-10-25 | Disposition: A | Discharge status: Home / Self Care | Payer: Medicare HMO | Source: Ambulatory Visit | Attending: Internal Medicine | Admitting: Internal Medicine

## 2015-10-25 DIAGNOSIS — Z1231 Encounter for screening mammogram for malignant neoplasm of breast: Secondary | ICD-10-CM | POA: Diagnosis not present

## 2016-02-13 ENCOUNTER — Ambulatory Visit (HOSPITAL_COMMUNITY)
Admission: RE | Admit: 2016-02-13 | Discharge: 2016-02-13 | Disposition: A | Discharge status: Home / Self Care | Payer: Medicare HMO | Source: Ambulatory Visit | Attending: Family Medicine | Admitting: Family Medicine

## 2016-02-13 ENCOUNTER — Other Ambulatory Visit (HOSPITAL_COMMUNITY): Payer: Self-pay | Admitting: Family Medicine

## 2016-02-13 DIAGNOSIS — Z8781 Personal history of (healed) traumatic fracture: Secondary | ICD-10-CM | POA: Insufficient documentation

## 2016-02-13 DIAGNOSIS — E782 Mixed hyperlipidemia: Secondary | ICD-10-CM | POA: Diagnosis not present

## 2016-02-13 DIAGNOSIS — M160 Bilateral primary osteoarthritis of hip: Secondary | ICD-10-CM | POA: Diagnosis not present

## 2016-02-13 DIAGNOSIS — Z1389 Encounter for screening for other disorder: Secondary | ICD-10-CM | POA: Diagnosis not present

## 2016-02-13 DIAGNOSIS — Z0001 Encounter for general adult medical examination with abnormal findings: Secondary | ICD-10-CM | POA: Diagnosis not present

## 2016-02-13 DIAGNOSIS — M25551 Pain in right hip: Secondary | ICD-10-CM | POA: Diagnosis not present

## 2016-02-13 DIAGNOSIS — E669 Obesity, unspecified: Secondary | ICD-10-CM | POA: Diagnosis not present

## 2016-02-13 DIAGNOSIS — Z780 Asymptomatic menopausal state: Secondary | ICD-10-CM

## 2016-02-14 ENCOUNTER — Ambulatory Visit (HOSPITAL_COMMUNITY)
Admission: RE | Admit: 2016-02-14 | Discharge: 2016-02-14 | Disposition: A | Discharge status: Home / Self Care | Payer: Medicare HMO | Source: Ambulatory Visit | Attending: Family Medicine | Admitting: Family Medicine

## 2016-02-14 DIAGNOSIS — M810 Age-related osteoporosis without current pathological fracture: Secondary | ICD-10-CM | POA: Insufficient documentation

## 2016-02-14 DIAGNOSIS — Z780 Asymptomatic menopausal state: Secondary | ICD-10-CM

## 2016-06-27 DIAGNOSIS — M5146 Schmorl's nodes, lumbar region: Secondary | ICD-10-CM | POA: Diagnosis not present

## 2016-06-27 DIAGNOSIS — M170 Bilateral primary osteoarthritis of knee: Secondary | ICD-10-CM | POA: Diagnosis not present

## 2016-06-27 DIAGNOSIS — M48061 Spinal stenosis, lumbar region without neurogenic claudication: Secondary | ICD-10-CM | POA: Diagnosis not present

## 2016-06-27 DIAGNOSIS — M1611 Unilateral primary osteoarthritis, right hip: Secondary | ICD-10-CM | POA: Diagnosis not present

## 2016-06-27 DIAGNOSIS — M5136 Other intervertebral disc degeneration, lumbar region: Secondary | ICD-10-CM | POA: Diagnosis not present

## 2016-08-19 ENCOUNTER — Other Ambulatory Visit: Payer: Self-pay | Admitting: Specialist

## 2016-08-19 DIAGNOSIS — M5136 Other intervertebral disc degeneration, lumbar region: Secondary | ICD-10-CM

## 2016-08-29 ENCOUNTER — Other Ambulatory Visit: Payer: Self-pay | Admitting: Specialist

## 2016-08-29 ENCOUNTER — Ambulatory Visit
Admission: RE | Admit: 2016-08-29 | Discharge: 2016-08-29 | Disposition: A | Discharge status: Home / Self Care | Payer: Self-pay | Source: Ambulatory Visit | Attending: Specialist | Admitting: Specialist

## 2016-08-29 DIAGNOSIS — M5136 Other intervertebral disc degeneration, lumbar region: Secondary | ICD-10-CM

## 2016-08-30 ENCOUNTER — Ambulatory Visit
Admission: RE | Admit: 2016-08-30 | Discharge: 2016-08-30 | Disposition: A | Discharge status: Home / Self Care | Payer: Medicare HMO | Source: Ambulatory Visit | Attending: Specialist | Admitting: Specialist

## 2016-08-30 DIAGNOSIS — M5136 Other intervertebral disc degeneration, lumbar region: Secondary | ICD-10-CM

## 2016-08-30 DIAGNOSIS — M5126 Other intervertebral disc displacement, lumbar region: Secondary | ICD-10-CM | POA: Diagnosis not present

## 2016-08-30 MED ORDER — IOPAMIDOL (ISOVUE-M 200) INJECTION 41%
15.0000 mL | Freq: Once | INTRAMUSCULAR | Status: AC
  Administered 2016-08-30: 15 mL via INTRATHECAL

## 2016-08-30 MED ORDER — DIAZEPAM 5 MG PO TABS
5.0000 mg | ORAL_TABLET | Freq: Once | ORAL | Status: AC
  Administered 2016-08-30: 5 mg via ORAL

## 2016-08-30 NOTE — Discharge Instructions (Signed)

## 2016-09-06 DIAGNOSIS — M5416 Radiculopathy, lumbar region: Secondary | ICD-10-CM | POA: Diagnosis not present

## 2016-09-06 DIAGNOSIS — M5136 Other intervertebral disc degeneration, lumbar region: Secondary | ICD-10-CM | POA: Diagnosis not present

## 2016-09-06 DIAGNOSIS — M1611 Unilateral primary osteoarthritis, right hip: Secondary | ICD-10-CM | POA: Diagnosis not present

## 2016-09-06 DIAGNOSIS — M48061 Spinal stenosis, lumbar region without neurogenic claudication: Secondary | ICD-10-CM | POA: Diagnosis not present

## 2016-10-16 DIAGNOSIS — M1611 Unilateral primary osteoarthritis, right hip: Secondary | ICD-10-CM | POA: Diagnosis not present

## 2016-10-16 DIAGNOSIS — M25551 Pain in right hip: Secondary | ICD-10-CM | POA: Diagnosis not present

## 2016-10-25 NOTE — Patient Instructions (Signed)
[name]  10/25/2016   Your procedure is scheduled on: 11-05-16  Report to [facility] Main  Entrance Report to admitting at 9:00 AM   Call this number if you have problems the morning of surgery [phone]   Remember: ONLY 1 PERSON MAY GO WITH YOU TO SHORT STAY TO GET  READY MORNING OF YOUR SURGERY.  Do not eat food or drink liquids :After Midnight.     Take these medicines the morning of surgery with A SIP OF WATER: None                                You may not have any metal on your body including hair pins and              piercings  Do not wear jewelry, make-up, lotions, powders or perfumes, deodorant             Do not wear nail polish.  Do not shave  48 hours prior to surgery.     Do not bring valuables to the hospital. [place].  Contacts, dentures or bridgework may not be worn into surgery.  Leave suitcase in the car. After surgery it may be brought to your room.                  Please read over the following fact sheets you were given: _____________________________________________________________________             [facility] - Preparing for Surgery Before surgery, you can play an important role.  Because skin is not sterile, your skin needs to be as free of germs as possible.  You can reduce the number of germs on your skin by washing with CHG (chlorahexidine gluconate) soap before surgery.  CHG is an antiseptic cleaner which kills germs and bonds with the skin to continue killing germs even after washing. Please DO NOT use if you have an allergy to CHG or antibacterial soaps.  If your skin becomes reddened/irritated stop using the CHG and inform your nurse when you arrive at Short Stay. Do not shave (including legs and underarms) for at least 48 hours prior to the first CHG shower.  You may shave your face/neck. Please follow these instructions carefully:  1.  Shower with CHG Soap  the night before surgery and the  morning of Surgery.  2.  If you choose to wash your hair, wash your hair first as usual with your  normal  shampoo.  3.  After you shampoo, rinse your hair and body thoroughly to remove the  shampoo.                           4.  Use CHG as you would any other liquid soap.  You can apply chg directly  to the skin and wash                       Gently with a scrungie or clean washcloth.  5.  Apply the CHG Soap to your body ONLY FROM THE NECK DOWN.   Do not use on face/ open  Wound or open sores. Avoid contact with eyes, ears mouth and genitals (private parts).                       Wash face,  Genitals (private parts) with your normal soap.             6.  Wash thoroughly, paying special attention to the area where your surgery  will be performed.  7.  Thoroughly rinse your body with warm water from the neck down.  8.  DO NOT shower/wash with your normal soap after using and rinsing off  the CHG Soap.                9.  Pat yourself dry with a clean towel.            10.  Wear clean pajamas.            11.  Place clean sheets on your bed the night of your first shower and do not  sleep with pets. Day of Surgery : Do not apply any lotions/deodorants the morning of surgery.  Please wear clean clothes to the hospital/surgery center.  FAILURE TO FOLLOW THESE INSTRUCTIONS MAY RESULT IN THE CANCELLATION OF YOUR SURGERY PATIENT SIGNATURE_________________________________  NURSE SIGNATURE__________________________________  ________________________________________________________________________   Adam Phenix  An incentive spirometer is a tool that can help keep your lungs clear and active. This tool measures how well you are filling your lungs with each breath. Taking long deep breaths may help reverse or decrease the chance of developing breathing (pulmonary) problems (especially infection) following:  A long period of time when  you are unable to move or be active. BEFORE THE PROCEDURE   If the spirometer includes an indicator to show your best effort, your nurse or respiratory therapist will set it to a desired goal.  If possible, sit up straight or lean slightly forward. Try not to slouch.  Hold the incentive spirometer in an upright position. INSTRUCTIONS FOR USE  1. Sit on the edge of your bed if possible, or sit up as far as you can in bed or on a chair. 2. Hold the incentive spirometer in an upright position. 3. Breathe out normally. 4. Place the mouthpiece in your mouth and seal your lips tightly around it. 5. Breathe in slowly and as deeply as possible, raising the piston or the ball toward the top of the column. 6. Hold your breath for 3-5 seconds or for as long as possible. Allow the piston or ball to fall to the bottom of the column. 7. Remove the mouthpiece from your mouth and breathe out normally. 8. Rest for a few seconds and repeat Steps 1 through 7 at least 10 times every 1-2 hours when you are awake. Take your time and take a few normal breaths between deep breaths. 9. The spirometer may include an indicator to show your best effort. Use the indicator as a goal to work toward during each repetition. 10. After each set of 10 deep breaths, practice coughing to be sure your lungs are clear. If you have an incision (the cut made at the time of surgery), support your incision when coughing by placing a pillow or rolled up towels firmly against it. Once you are able to get out of bed, walk around indoors and cough well. You may stop using the incentive spirometer when instructed by your caregiver.  RISKS AND COMPLICATIONS  Take your time so you do not get  dizzy or light-headed.  If you are in pain, you may need to take or ask for pain medication before doing incentive spirometry. It is harder to take a deep breath if you are having pain. AFTER USE  Rest and breathe slowly and easily.  It can be  helpful to keep track of a log of your progress. Your caregiver can provide you with a simple table to help with this. If you are using the spirometer at home, follow these instructions: [place] IF:   You are having difficultly using the spirometer.  You have trouble using the spirometer as often as instructed.  Your pain medication is not giving enough relief while using the spirometer.  You develop fever of 100.5 F (38.1 C) or higher. SEEK IMMEDIATE MEDICAL CARE IF:   You cough up bloody sputum that had not been present before.  You develop fever of 102 F (38.9 C) or greater.  You develop worsening pain at or near the incision site. MAKE SURE YOU:   Understand these instructions.  Will watch your condition.  Will get help right away if you are not doing well or get worse. Document Released: 07/15/2006 Document Revised: 05/27/2011 Document Reviewed: 09/15/2006 Exit[employer] Patient Information 2014 Exit[employer], [place].   ________________________________________________________________________  WHAT IS A BLOOD TRANSFUSION? Blood Transfusion Information  A transfusion is the replacement of blood or some of its parts. Blood is made up of multiple cells which provide different functions.  Red blood cells carry oxygen and are used for blood loss replacement.  White blood cells fight against infection.  Platelets control bleeding.  Plasma helps clot blood.  Other blood products are available for specialized needs, such as hemophilia or other clotting disorders. BEFORE THE TRANSFUSION  Who gives blood for transfusions?   Healthy volunteers who are fully evaluated to make sure their blood is safe. This is blood bank blood. Transfusion therapy is the safest it has ever been in the practice of medicine. Before blood is taken from a donor, a complete history is taken to make sure that person has no history of diseases nor engages in risky social behavior (examples are  intravenous drug use or sexual activity with multiple partners). The donor's travel history is screened to minimize risk of transmitting infections, such as malaria. The donated blood is tested for signs of infectious diseases, such as HIV and hepatitis. The blood is then tested to be sure it is compatible with you in order to minimize the chance of a transfusion reaction. If you or a relative donates blood, this is often done in anticipation of surgery and is not appropriate for emergency situations. It takes many days to process the donated blood. RISKS AND COMPLICATIONS Although transfusion therapy is very safe and saves many lives, the main dangers of transfusion include:   Getting an infectious disease.  Developing a transfusion reaction. This is an allergic reaction to something in the blood you were given. Every precaution is taken to prevent this. The decision to have a blood transfusion has been considered carefully by your caregiver before blood is given. Blood is not given unless the benefits outweigh the risks. AFTER THE TRANSFUSION  Right after receiving a blood transfusion, you will usually feel much better and more energetic. This is especially true if your red blood cells have gotten low (anemic). The transfusion raises the level of the red blood cells which carry oxygen, and this usually causes an energy increase.  The nurse administering the transfusion will  monitor you carefully for complications. HOME CARE INSTRUCTIONS  No special instructions are needed after a transfusion. You may find your energy is better. Speak with your caregiver about any limitations on activity for underlying diseases you may have. SEEK MEDICAL CARE IF:   Your condition is not improving after your transfusion.  You develop redness or irritation at the intravenous (IV) site. SEEK IMMEDIATE MEDICAL CARE IF:  Any of the following symptoms occur over the next 12 hours:  Shaking chills.  You have a  temperature by mouth above 102 F (38.9 C), not controlled by medicine.  Chest, back, or muscle pain.  People around you feel you are not acting correctly or are confused.  Shortness of breath or difficulty breathing.  Dizziness and fainting.  You get a rash or develop hives.  You have a decrease in urine output.  Your urine turns a dark color or changes to pink, red, or brown. Any of the following symptoms occur over the next 10 days:  You have a temperature by mouth above 102 F (38.9 C), not controlled by medicine.  Shortness of breath.  Weakness after normal activity.  The white part of the eye turns yellow (jaundice).  You have a decrease in the amount of urine or are urinating less often.  Your urine turns a dark color or changes to pink, red, or brown. Document Released: 03/01/2000 Document Revised: 05/27/2011 Document Reviewed: 10/19/2007 [facility] Patient Information 2014 [place], [place].  _______________________________________________________________________

## 2016-10-29 ENCOUNTER — Encounter (INDEPENDENT_AMBULATORY_CARE_PROVIDER_SITE_OTHER): Payer: Self-pay

## 2016-10-29 ENCOUNTER — Encounter (HOSPITAL_COMMUNITY)
Admission: RE | Admit: 2016-10-29 | Discharge: 2016-10-29 | Disposition: A | Discharge status: Home / Self Care | Payer: Medicare HMO | Source: Ambulatory Visit | Attending: Orthopedic Surgery | Admitting: Orthopedic Surgery

## 2016-10-29 ENCOUNTER — Encounter (HOSPITAL_COMMUNITY): Payer: Self-pay

## 2016-10-29 DIAGNOSIS — Z01812 Encounter for preprocedural laboratory examination: Secondary | ICD-10-CM | POA: Diagnosis present

## 2016-10-29 DIAGNOSIS — M1611 Unilateral primary osteoarthritis, right hip: Secondary | ICD-10-CM | POA: Insufficient documentation

## 2016-10-29 HISTORY — DX: Malignant (primary) neoplasm, unspecified: C80.1

## 2016-10-29 LAB — CBC
HEMATOCRIT: 41.3 % (ref 36.0–46.0)
Hemoglobin: 14.3 g/dL (ref 12.0–15.0)
MCH: 32.9 pg (ref 26.0–34.0)
MCHC: 34.6 g/dL (ref 30.0–36.0)
MCV: 94.9 fL (ref 78.0–100.0)
Platelets: 192 10*3/uL (ref 150–400)
RBC: 4.35 MIL/uL (ref 3.87–5.11)
RDW: 12.8 % (ref 11.5–15.5)
WBC: 6.4 10*3/uL (ref 4.0–10.5)

## 2016-10-29 LAB — BASIC METABOLIC PANEL
Anion gap: 8 (ref 5–15)
BUN: 17 mg/dL (ref 6–20)
CALCIUM: 9.5 mg/dL (ref 8.9–10.3)
CHLORIDE: 106 mmol/L (ref 101–111)
CO2: 24 mmol/L (ref 22–32)
CREATININE: 0.89 mg/dL (ref 0.44–1.00)
GFR calc Af Amer: >60 mL/min (ref >60)
GFR calc non Af Amer: >60 mL/min (ref >60)
GLUCOSE: 102 mg/dL — AB (ref 65–99)
Potassium: 4.5 mmol/L (ref 3.5–5.1)
Sodium: 138 mmol/L (ref 135–145)

## 2016-10-29 LAB — SURGICAL PCR SCREEN
MRSA, PCR: NEGATIVE
Staphylococcus aureus: NEGATIVE

## 2016-10-30 NOTE — H&P (Signed)
TOTAL HIP ADMISSION H&P  Patient is admitted for right total hip arthroplasty, anterior approach.  Subjective:  Chief Complaint:   Right hip primary OA / pain  HPI: [name], 71 y.o. female, has a history of pain and functional disability in the right hip(s) due to arthritis and patient has failed non-surgical conservative treatments for greater than 12 weeks to include NSAID's and/or analgesics, use of assistive devices and activity modification.  Onset of symptoms was gradual starting 2+ years ago with gradually worsening course since that time.The patient noted no past surgery on the right hip(s).  Patient currently rates pain in the right hip at 10 out of 10 with activity. Patient has night pain, worsening of pain with activity and weight bearing, trendelenberg gait, pain that interfers with activities of daily living and pain with passive range of motion. Patient has evidence of periarticular osteophytes and joint space narrowing by imaging studies. This condition presents safety issues increasing the risk of falls.  There is no current active infection.   Risks, benefits and expectations were discussed with the patient.  Risks including but not limited to the risk of anesthesia, blood clots, nerve damage, blood vessel damage, failure of the prosthesis, infection and up to and including death.  Patient understand the risks, benefits and expectations and wishes to proceed with surgery.   PCP: Redmond School, MD  D/C Plans:       Home  Post-op Meds:       No Rx given   Tranexamic Acid:      To be given - IV    Decadron:      Is to be given  FYI:     ASA  Norco  DME:   Pt already has equipment   PT:    No PT   Past Medical History:  Diagnosis Date  . Cancer ([place]) 1991   Breast    Past Surgical History:  Procedure Laterality Date  . FOOT SURGERY  2006  . JOINT REPLACEMENT      No prescriptions prior to admission.   No Known Allergies   Social History  Substance Use  Topics  . Smoking status: Never Smoker  . Smokeless tobacco: Never Used  . Alcohol use No       Review of Systems  Constitutional: Negative.   HENT: Positive for tinnitus.   Eyes: Negative.   Respiratory: Negative.   Cardiovascular: Negative.   Gastrointestinal: Negative.   Genitourinary: Negative.   Musculoskeletal: Positive for back pain and joint pain.  Skin: Negative.   Neurological: Negative.   Endo/Heme/Allergies: Negative.   Psychiatric/Behavioral: The patient has insomnia.     Objective:  Physical Exam  Constitutional: She is oriented to person, place, and time. She appears well-developed.  HENT:  Head: Normocephalic.  Eyes: Pupils are equal, round, and reactive to light.  Neck: Neck supple. No JVD present. No tracheal deviation present. No thyromegaly present.  Cardiovascular: Normal rate, regular rhythm and intact distal pulses.   Respiratory: Effort normal and breath sounds normal. No respiratory distress. She has no wheezes.  GI: Soft. There is no tenderness. There is no guarding.  Musculoskeletal:       Right hip: She exhibits decreased range of motion, decreased strength, tenderness and bony tenderness. She exhibits no swelling, no deformity and no laceration.  Lymphadenopathy:    She has no cervical adenopathy.  Neurological: She is alert and oriented to person, place, and time.  Skin: Skin is warm and dry.  Psychiatric: She has a normal mood and affect.      Labs:  Estimated body mass index is 33.48 kg/m as calculated from the following:   Height as of 10/29/16: 5' 3.5" (1.613 m).   Weight as of 10/29/16: 87.1 kg (192 lb).   Imaging Review Plain radiographs demonstrate severe degenerative joint disease of the right hip(s). The bone quality appears to be good for age and reported activity level.  Assessment/Plan:  End stage arthritis, right hip(s)  The patient history, physical examination, clinical judgement of the provider and imaging studies  are consistent with end stage degenerative joint disease of the right hip(s) and total hip arthroplasty is deemed medically necessary. The treatment options including medical management, injection therapy, arthroscopy and arthroplasty were discussed at length. The risks and benefits of total hip arthroplasty were presented and reviewed. The risks due to aseptic loosening, infection, stiffness, dislocation/subluxation,  thromboembolic complications and other imponderables were discussed.  The patient acknowledged the explanation, agreed to proceed with the plan and consent was signed. Patient is being admitted for inpatient treatment for surgery, pain control, PT, OT, prophylactic antibiotics, VTE prophylaxis, progressive ambulation and ADL's and discharge planning.The patient is planning to be discharged home.     [place] Pugh [name]   PA-C  10/30/2016, 11:44 AM

## 2016-11-01 DIAGNOSIS — E6609 Other obesity due to excess calories: Secondary | ICD-10-CM | POA: Diagnosis not present

## 2016-11-01 DIAGNOSIS — R7309 Other abnormal glucose: Secondary | ICD-10-CM | POA: Diagnosis not present

## 2016-11-01 DIAGNOSIS — Z6833 Body mass index (BMI) 33.0-33.9, adult: Secondary | ICD-10-CM | POA: Diagnosis not present

## 2016-11-01 DIAGNOSIS — Z01810 Encounter for preprocedural cardiovascular examination: Secondary | ICD-10-CM | POA: Diagnosis not present

## 2016-11-01 DIAGNOSIS — E782 Mixed hyperlipidemia: Secondary | ICD-10-CM | POA: Diagnosis not present

## 2016-11-01 DIAGNOSIS — Z1389 Encounter for screening for other disorder: Secondary | ICD-10-CM | POA: Diagnosis not present

## 2016-11-05 ENCOUNTER — Inpatient Hospital Stay (HOSPITAL_COMMUNITY): Payer: Medicare HMO | Admitting: Certified Registered Nurse Anesthetist

## 2016-11-05 ENCOUNTER — Inpatient Hospital Stay (HOSPITAL_COMMUNITY)
Admission: RE | Admit: 2016-11-05 | Discharge: 2016-11-06 | DRG: 470 | Disposition: A | Discharge status: Home / Self Care | Payer: Medicare HMO | Source: Ambulatory Visit | Attending: Orthopedic Surgery | Admitting: Orthopedic Surgery

## 2016-11-05 ENCOUNTER — Inpatient Hospital Stay (HOSPITAL_COMMUNITY): Payer: Medicare HMO

## 2016-11-05 ENCOUNTER — Encounter (HOSPITAL_COMMUNITY)
Admission: RE | Disposition: A | Discharge status: Home / Self Care | Payer: Self-pay | Source: Ambulatory Visit | Attending: Orthopedic Surgery

## 2016-11-05 ENCOUNTER — Encounter (HOSPITAL_COMMUNITY): Payer: Self-pay | Admitting: *Deleted

## 2016-11-05 DIAGNOSIS — Z9889 Other specified postprocedural states: Secondary | ICD-10-CM | POA: Diagnosis not present

## 2016-11-05 DIAGNOSIS — E669 Obesity, unspecified: Secondary | ICD-10-CM | POA: Diagnosis present

## 2016-11-05 DIAGNOSIS — Z96659 Presence of unspecified artificial knee joint: Secondary | ICD-10-CM | POA: Diagnosis not present

## 2016-11-05 DIAGNOSIS — Z9181 History of falling: Secondary | ICD-10-CM | POA: Diagnosis not present

## 2016-11-05 DIAGNOSIS — Z96649 Presence of unspecified artificial hip joint: Secondary | ICD-10-CM

## 2016-11-05 DIAGNOSIS — C50919 Malignant neoplasm of unspecified site of unspecified female breast: Secondary | ICD-10-CM | POA: Diagnosis not present

## 2016-11-05 DIAGNOSIS — Z96641 Presence of right artificial hip joint: Secondary | ICD-10-CM

## 2016-11-05 DIAGNOSIS — M1611 Unilateral primary osteoarthritis, right hip: Principal | ICD-10-CM | POA: Diagnosis present

## 2016-11-05 DIAGNOSIS — Z6833 Body mass index (BMI) 33.0-33.9, adult: Secondary | ICD-10-CM | POA: Diagnosis not present

## 2016-11-05 DIAGNOSIS — Z471 Aftercare following joint replacement surgery: Secondary | ICD-10-CM | POA: Diagnosis not present

## 2016-11-05 HISTORY — PX: TOTAL HIP ARTHROPLASTY: SHX124

## 2016-11-05 LAB — TYPE AND SCREEN
ABO/RH(D): B POS
Antibody Screen: NEGATIVE

## 2016-11-05 SURGERY — ARTHROPLASTY, HIP, TOTAL, ANTERIOR APPROACH
Anesthesia: Spinal | Site: Hip | Laterality: Right

## 2016-11-05 MED ORDER — METHOCARBAMOL 1000 MG/10ML IJ SOLN
500.0000 mg | Freq: Four times a day (QID) | INTRAVENOUS | Status: DC | PRN
  Administered 2016-11-05 (×2): 500 mg via INTRAVENOUS
  Filled 2016-11-05 (×2): qty 550

## 2016-11-05 MED ORDER — METOCLOPRAMIDE HCL 5 MG/ML IJ SOLN: 5.0000 mg | Freq: Three times a day (TID) | INTRAMUSCULAR | Status: DC | PRN

## 2016-11-05 MED ORDER — TRANEXAMIC ACID 1000 MG/10ML IV SOLN
1000.0000 mg | INTRAVENOUS | Status: AC
  Administered 2016-11-05: 1000 mg via INTRAVENOUS
  Filled 2016-11-05: qty 1100

## 2016-11-05 MED ORDER — ALUM & MAG HYDROXIDE-SIMETH 200-200-20 MG/5ML PO SUSP: 15.0000 mL | ORAL | Status: DC | PRN

## 2016-11-05 MED ORDER — PROPOFOL 10 MG/ML IV BOLUS
INTRAVENOUS | Status: DC | PRN
Start: 2016-11-05 — End: 2016-11-05
  Administered 2016-11-05: 30 mg via INTRAVENOUS

## 2016-11-05 MED ORDER — ONDANSETRON HCL 4 MG/2ML IJ SOLN
INTRAMUSCULAR | Status: DC | PRN
  Administered 2016-11-05: 4 mg via INTRAVENOUS

## 2016-11-05 MED ORDER — ASPIRIN 81 MG PO CHEW
81.0000 mg | CHEWABLE_TABLET | Freq: Two times a day (BID) | ORAL | Status: DC
  Administered 2016-11-05 – 2016-11-06 (×2): 81 mg via ORAL
  Filled 2016-11-05 (×2): qty 1

## 2016-11-05 MED ORDER — DEXAMETHASONE SODIUM PHOSPHATE 10 MG/ML IJ SOLN
10.0000 mg | Freq: Once | INTRAMUSCULAR | Status: AC
  Administered 2016-11-06: 10 mg via INTRAVENOUS
  Filled 2016-11-05: qty 1

## 2016-11-05 MED ORDER — PHENYLEPHRINE 40 MCG/ML (10ML) SYRINGE FOR IV PUSH (FOR BLOOD PRESSURE SUPPORT)
PREFILLED_SYRINGE | INTRAVENOUS | Status: AC
  Filled 2016-11-05: qty 20

## 2016-11-05 MED ORDER — CHLORHEXIDINE GLUCONATE 4 % EX LIQD: 60.0000 mL | Freq: Once | CUTANEOUS | Status: DC

## 2016-11-05 MED ORDER — MENTHOL 3 MG MT LOZG: 1.0000 | LOZENGE | OROMUCOSAL | Status: DC | PRN

## 2016-11-05 MED ORDER — ONDANSETRON HCL 4 MG/2ML IJ SOLN: 4.0000 mg | Freq: Four times a day (QID) | INTRAMUSCULAR | Status: DC | PRN

## 2016-11-05 MED ORDER — MIDAZOLAM HCL 5 MG/5ML IJ SOLN
INTRAMUSCULAR | Status: DC | PRN
  Administered 2016-11-05: 2 mg via INTRAVENOUS

## 2016-11-05 MED ORDER — PROPOFOL 10 MG/ML IV BOLUS
INTRAVENOUS | Status: AC
  Filled 2016-11-05: qty 20

## 2016-11-05 MED ORDER — FERROUS SULFATE 325 (65 FE) MG PO TABS: 325.0000 mg | ORAL_TABLET | Freq: Three times a day (TID) | ORAL | 3 refills | Status: DC

## 2016-11-05 MED ORDER — ONDANSETRON HCL 4 MG PO TABS: 4.0000 mg | ORAL_TABLET | Freq: Four times a day (QID) | ORAL | Status: DC | PRN

## 2016-11-05 MED ORDER — LACTATED RINGERS IV SOLN
INTRAVENOUS | Status: DC
  Administered 2016-11-05 (×4): via INTRAVENOUS

## 2016-11-05 MED ORDER — CELECOXIB 200 MG PO CAPS
200.0000 mg | ORAL_CAPSULE | Freq: Two times a day (BID) | ORAL | Status: DC
  Administered 2016-11-05 – 2016-11-06 (×2): 200 mg via ORAL
  Filled 2016-11-05 (×2): qty 1

## 2016-11-05 MED ORDER — PHENYLEPHRINE HCL 10 MG/ML IJ SOLN
INTRAMUSCULAR | Status: DC | PRN
  Administered 2016-11-05 (×3): 80 ug via INTRAVENOUS

## 2016-11-05 MED ORDER — ONDANSETRON HCL 4 MG/2ML IJ SOLN
INTRAMUSCULAR | Status: AC
  Filled 2016-11-05: qty 2

## 2016-11-05 MED ORDER — POLYETHYLENE GLYCOL 3350 17 G PO PACK
17.0000 g | PACK | Freq: Two times a day (BID) | ORAL | Status: DC
  Administered 2016-11-06: 17 g via ORAL
  Filled 2016-11-05: qty 1

## 2016-11-05 MED ORDER — PROPOFOL 10 MG/ML IV BOLUS
INTRAVENOUS | Status: AC
  Filled 2016-11-05: qty 60

## 2016-11-05 MED ORDER — CEFAZOLIN SODIUM-DEXTROSE 2-4 GM/100ML-% IV SOLN
2.0000 g | Freq: Four times a day (QID) | INTRAVENOUS | Status: AC
  Administered 2016-11-05 (×2): 2 g via INTRAVENOUS
  Filled 2016-11-05 (×2): qty 100

## 2016-11-05 MED ORDER — HYDROCODONE-ACETAMINOPHEN 7.5-325 MG PO TABS
1.0000 | ORAL_TABLET | ORAL | Status: DC
  Administered 2016-11-05 – 2016-11-06 (×6): 2 via ORAL
  Filled 2016-11-05 (×6): qty 2

## 2016-11-05 MED ORDER — DEXAMETHASONE SODIUM PHOSPHATE 10 MG/ML IJ SOLN
10.0000 mg | Freq: Once | INTRAMUSCULAR | Status: AC
  Administered 2016-11-05: 10 mg via INTRAVENOUS

## 2016-11-05 MED ORDER — DOCUSATE SODIUM 100 MG PO CAPS
100.0000 mg | ORAL_CAPSULE | Freq: Two times a day (BID) | ORAL | Status: DC
  Administered 2016-11-05 – 2016-11-06 (×2): 100 mg via ORAL
  Filled 2016-11-05 (×2): qty 1

## 2016-11-05 MED ORDER — SODIUM CHLORIDE 0.9 % IV SOLN
INTRAVENOUS | Status: DC
  Administered 2016-11-05 – 2016-11-06 (×2): via INTRAVENOUS

## 2016-11-05 MED ORDER — METOCLOPRAMIDE HCL 5 MG PO TABS: 5.0000 mg | ORAL_TABLET | Freq: Three times a day (TID) | ORAL | Status: DC | PRN

## 2016-11-05 MED ORDER — DOCUSATE SODIUM 100 MG PO CAPS: 100.0000 mg | ORAL_CAPSULE | Freq: Two times a day (BID) | ORAL | 0 refills | Status: DC

## 2016-11-05 MED ORDER — METHOCARBAMOL 500 MG PO TABS: 500.0000 mg | ORAL_TABLET | Freq: Four times a day (QID) | ORAL | 0 refills | Status: DC | PRN

## 2016-11-05 MED ORDER — HYDROCODONE-ACETAMINOPHEN 7.5-325 MG PO TABS: 1.0000 | ORAL_TABLET | ORAL | 0 refills | Status: DC | PRN

## 2016-11-05 MED ORDER — FERROUS SULFATE 325 (65 FE) MG PO TABS
325.0000 mg | ORAL_TABLET | Freq: Three times a day (TID) | ORAL | Status: DC
  Administered 2016-11-06 (×2): 325 mg via ORAL
  Filled 2016-11-05 (×2): qty 1

## 2016-11-05 MED ORDER — ACETAMINOPHEN 500 MG PO TABS
1000.0000 mg | ORAL_TABLET | Freq: Once | ORAL | Status: AC
  Administered 2016-11-05: 1000 mg via ORAL
  Filled 2016-11-05: qty 2

## 2016-11-05 MED ORDER — PHENOL 1.4 % MT LIQD
1.0000 | OROMUCOSAL | Status: DC | PRN
  Filled 2016-11-05: qty 177

## 2016-11-05 MED ORDER — ONDANSETRON HCL 4 MG/2ML IJ SOLN: 4.0000 mg | Freq: Once | INTRAMUSCULAR | Status: DC | PRN

## 2016-11-05 MED ORDER — FENTANYL CITRATE (PF) 100 MCG/2ML IJ SOLN
25.0000 ug | INTRAMUSCULAR | Status: DC | PRN
  Administered 2016-11-05 (×2): 25 ug via INTRAVENOUS
  Administered 2016-11-05: 50 ug via INTRAVENOUS

## 2016-11-05 MED ORDER — STERILE WATER FOR IRRIGATION IR SOLN
Status: DC | PRN
  Administered 2016-11-05: 2000 mL

## 2016-11-05 MED ORDER — SODIUM CHLORIDE 0.9 % IR SOLN
Status: DC | PRN
  Administered 2016-11-05: 1000 mL

## 2016-11-05 MED ORDER — BISACODYL 10 MG RE SUPP: 10.0000 mg | Freq: Every day | RECTAL | Status: DC | PRN

## 2016-11-05 MED ORDER — MIDAZOLAM HCL 2 MG/2ML IJ SOLN
INTRAMUSCULAR | Status: AC
  Filled 2016-11-05: qty 2

## 2016-11-05 MED ORDER — BUPIVACAINE IN DEXTROSE 0.75-8.25 % IT SOLN
INTRATHECAL | Status: DC | PRN
  Administered 2016-11-05: 2 mL via INTRATHECAL

## 2016-11-05 MED ORDER — ASPIRIN 81 MG PO CHEW: 81.0000 mg | CHEWABLE_TABLET | Freq: Two times a day (BID) | ORAL | 0 refills | Status: AC

## 2016-11-05 MED ORDER — DIPHENHYDRAMINE HCL 25 MG PO CAPS: 25.0000 mg | ORAL_CAPSULE | Freq: Four times a day (QID) | ORAL | Status: DC | PRN

## 2016-11-05 MED ORDER — PROPOFOL 500 MG/50ML IV EMUL
INTRAVENOUS | Status: DC | PRN
  Administered 2016-11-05: 125 ug/kg/min via INTRAVENOUS

## 2016-11-05 MED ORDER — MAGNESIUM CITRATE PO SOLN: 1.0000 | Freq: Once | ORAL | Status: DC | PRN

## 2016-11-05 MED ORDER — FENTANYL CITRATE (PF) 100 MCG/2ML IJ SOLN
25.0000 ug | INTRAMUSCULAR | Status: DC | PRN
  Administered 2016-11-05: 25 ug via INTRAVENOUS
  Filled 2016-11-05: qty 2

## 2016-11-05 MED ORDER — POLYETHYLENE GLYCOL 3350 17 G PO PACK: 17.0000 g | PACK | Freq: Two times a day (BID) | ORAL | 0 refills | Status: DC

## 2016-11-05 MED ORDER — TRANEXAMIC ACID 1000 MG/10ML IV SOLN
1000.0000 mg | Freq: Once | INTRAVENOUS | Status: AC
  Administered 2016-11-05: 1000 mg via INTRAVENOUS
  Filled 2016-11-05: qty 1100

## 2016-11-05 MED ORDER — CEFAZOLIN SODIUM-DEXTROSE 2-4 GM/100ML-% IV SOLN
2.0000 g | INTRAVENOUS | Status: AC
  Administered 2016-11-05: 2 g via INTRAVENOUS

## 2016-11-05 MED ORDER — FENTANYL CITRATE (PF) 100 MCG/2ML IJ SOLN
INTRAMUSCULAR | Status: AC
  Filled 2016-11-05: qty 2

## 2016-11-05 MED ORDER — DEXAMETHASONE SODIUM PHOSPHATE 10 MG/ML IJ SOLN
INTRAMUSCULAR | Status: AC
  Filled 2016-11-05: qty 1

## 2016-11-05 MED ORDER — METHOCARBAMOL 500 MG PO TABS
500.0000 mg | ORAL_TABLET | Freq: Four times a day (QID) | ORAL | Status: DC | PRN
  Administered 2016-11-06: 500 mg via ORAL
  Filled 2016-11-05: qty 1

## 2016-11-05 MED ORDER — CEFAZOLIN SODIUM-DEXTROSE 2-4 GM/100ML-% IV SOLN
INTRAVENOUS | Status: AC
  Filled 2016-11-05: qty 100

## 2016-11-05 SURGICAL SUPPLY — 34 items
ADH SKN CLS APL DERMABOND .7 (GAUZE/BANDAGES/DRESSINGS) ×1
BAG SPEC THK2 15X12 ZIP CLS (MISCELLANEOUS) ×1
BAG ZIPLOCK 12X15 (MISCELLANEOUS) ×2 IMPLANT
BLADE SAG 18X100X1.27 (BLADE) ×3 IMPLANT
CAPT HIP TOTAL 2 ×2 IMPLANT
CLOTH BEACON ORANGE TIMEOUT ST (SAFETY) ×3 IMPLANT
COVER PERINEAL POST (MISCELLANEOUS) ×3 IMPLANT
COVER SURGICAL LIGHT HANDLE (MISCELLANEOUS) ×3 IMPLANT
DERMABOND ADVANCED (GAUZE/BANDAGES/DRESSINGS) ×2
DERMABOND ADVANCED .7 DNX12 (GAUZE/BANDAGES/DRESSINGS) ×1 IMPLANT
DRAPE STERI IOBAN 125X83 (DRAPES) ×3 IMPLANT
DRAPE U-SHAPE 47X51 STRL (DRAPES) ×6 IMPLANT
DRESSING AQUACEL AG SP 3.5X10 (GAUZE/BANDAGES/DRESSINGS) ×1 IMPLANT
DRSG AQUACEL AG SP 3.5X10 (GAUZE/BANDAGES/DRESSINGS) ×3
DURAPREP 26ML APPLICATOR (WOUND CARE) ×3 IMPLANT
ELECT REM PT RETURN 15FT ADLT (MISCELLANEOUS) ×3 IMPLANT
GLOVE BIOGEL M STRL SZ7.5 (GLOVE) ×4 IMPLANT
GLOVE BIOGEL PI IND STRL 7.5 (GLOVE) ×1 IMPLANT
GLOVE BIOGEL PI INDICATOR 7.5 (GLOVE) ×12
GLOVE ORTHO TXT STRL SZ7.5 (GLOVE) ×3 IMPLANT
GLOVE SURG SS PI 7.5 STRL IVOR (GLOVE) ×4 IMPLANT
GOWN SPEC L3 XXLG W/TWL (GOWN DISPOSABLE) ×2 IMPLANT
GOWN STRL REUS W/TWL LRG LVL3 (GOWN DISPOSABLE) ×3 IMPLANT
GOWN STRL REUS W/TWL XL LVL3 (GOWN DISPOSABLE) ×5 IMPLANT
HOLDER FOLEY CATH W/STRAP (MISCELLANEOUS) ×3 IMPLANT
PACK ANTERIOR HIP CUSTOM (KITS) ×3 IMPLANT
SUT MNCRL AB 4-0 PS2 18 (SUTURE) ×3 IMPLANT
SUT STRATAFIX 0 PDS 27 VIOLET (SUTURE) ×3
SUT VIC AB 1 CT1 36 (SUTURE) ×9 IMPLANT
SUT VIC AB 2-0 CT1 27 (SUTURE) ×6
SUT VIC AB 2-0 CT1 TAPERPNT 27 (SUTURE) ×2 IMPLANT
SUTURE STRATFX 0 PDS 27 VIOLET (SUTURE) ×1 IMPLANT
TRAY FOLEY CATH SILVER 14FR (SET/KITS/TRAYS/PACK) ×2 IMPLANT
YANKAUER SUCT BULB TIP 10FT TU (MISCELLANEOUS) ×2 IMPLANT

## 2016-11-05 NOTE — Discharge Instructions (Signed)

## 2016-11-05 NOTE — Anesthesia Postprocedure Evaluation (Signed)
Anesthesia Post Note  Patient: [name]  Procedure(s) Performed: Procedure(s) (LRB): RIGHT TOTAL HIP ARTHROPLASTY ANTERIOR APPROACH (Right)     Patient location during evaluation: PACU Anesthesia Type: Spinal Level of consciousness: oriented and awake and alert Pain management: pain level controlled Vital Signs Assessment: post-procedure vital signs reviewed and stable Respiratory status: spontaneous breathing and respiratory function stable Cardiovascular status: blood pressure returned to baseline and stable Postop Assessment: no headache and no backache Anesthetic complications: no    Last Vitals:  Vitals:   11/05/16 1515 11/05/16 1527  BP: 120/69   Pulse: 80 73  Resp: 16 16  Temp:  36.7 C  SpO2: 100% 100%    Last Pain:  Vitals:   11/05/16 1527  TempSrc:   PainSc: 3     LLE Motor Response: Purposeful movement (11/05/16 1527) LLE Sensation: Numbness (11/05/16 1527) RLE Motor Response: Purposeful movement (11/05/16 1527) RLE Sensation: Numbness (11/05/16 1527) L Sensory Level: L5-Outer lower leg, top of foot, great toe (11/05/16 1527) R Sensory Level: S1-Sole of foot, small toes (11/05/16 1527)  Catalina Gravel

## 2016-11-05 NOTE — Transfer of Care (Signed)
Immediate Anesthesia Transfer of Care Note  Patient: [name]  Procedure(s) Performed: Procedure(s): RIGHT TOTAL HIP ARTHROPLASTY ANTERIOR APPROACH (Right)  Patient Location: PACU  Anesthesia Type:Spinal  Level of Consciousness: awake, alert  and oriented  Airway & Oxygen Therapy: Patient Spontanous Breathing and Patient connected to face mask oxygen  Post-op Assessment: Report given to RN and Post -op Vital signs reviewed and stable  Post vital signs: Reviewed and stable  Last Vitals:  Vitals:   11/05/16 0923  BP: 137/87  Pulse: 91  Resp: 16  Temp: 37.2 C  SpO2: 98%    Last Pain:  Vitals:   11/05/16 0923  TempSrc: Oral         Complications: No apparent anesthesia complications

## 2016-11-05 NOTE — Op Note (Signed)
NAME:  [name]                ACCOUNT NO.: [id number]      MEDICAL RECORD NO.: [id number]      FACILITY:  [facility]      PHYSICIAN:  Paralee Cancel D  DATE OF BIRTH:  [birth year]     DATE OF PROCEDURE:  11/05/2016                                 OPERATIVE REPORT         PREOPERATIVE DIAGNOSIS: Right  hip osteoarthritis.      POSTOPERATIVE DIAGNOSIS:  Right hip osteoarthritis.      PROCEDURE:  Right total hip replacement through an anterior approach   utilizing DePuy THR system, component size 35mm pinnacle cup, a size 32+4 neutral   Altrex liner, a size 5 Hi Tri Lock stem with a 32+1 delta ceramic   ball.      SURGEON:  Pietro Cassis. Alvan Dame, M.D.      ASSISTANT:  Nehemiah Massed, PA-C     ANESTHESIA:  Spinal.      SPECIMENS:  None.      COMPLICATIONS:  None.      BLOOD LOSS:  250 cc     DRAINS:  None.      INDICATION OF THE PROCEDURE:  [name] is a 71 y.o. female who had   presented to office for evaluation of right hip pain.  Radiographs revealed   progressive degenerative changes with bone-on-bone   articulation to the  hip joint.  The patient had painful limited range of   motion significantly affecting their overall quality of life.  The patient was failing to    respond to conservative measures, and at this point was ready   to proceed with more definitive measures.  The patient has noted progressive   degenerative changes in his hip, progressive problems and dysfunction   with regarding the hip prior to surgery.  Consent was obtained for   benefit of pain relief.  Specific risk of infection, DVT, component   failure, dislocation, need for revision surgery, as well discussion of   the anterior versus posterior approach were reviewed.  Consent was   obtained for benefit of anterior pain relief through an anterior   approach.      PROCEDURE IN DETAIL:  The patient was brought to operative theater.   Once adequate anesthesia, preoperative  antibiotics, 2 gm of Ancef, 1 gm of Tranexamic Acid, and 10 mg of Decadron administered.   The patient was positioned supine on the OSI Hanna table.  Once adequate   padding of boney process was carried out, we had predraped out the hip, and  used fluoroscopy to confirm orientation of the pelvis and position.      The right hip was then prepped and draped from proximal iliac crest to   mid thigh with shower curtain technique.      Time-out was performed identifying the patient, planned procedure, and   extremity.     An incision was then made 2 cm distal and lateral to the   anterior superior iliac spine extending over the orientation of the   tensor fascia lata muscle and sharp dissection was carried down to the   fascia of the muscle and protractor placed in the soft tissues.      The fascia  was then incised.  The muscle belly was identified and swept   laterally and retractor placed along the superior neck.  Following   cauterization of the circumflex vessels and removing some pericapsular   fat, a second cobra retractor was placed on the inferior neck.  A third   retractor was placed on the anterior acetabulum after elevating the   anterior rectus.  A L-capsulotomy was along the line of the   superior neck to the trochanteric fossa, then extended proximally and   distally.  Tag sutures were placed and the retractors were then placed   intracapsular.  We then identified the trochanteric fossa and   orientation of my neck cut, confirmed this radiographically   and then made a neck osteotomy with the femur on traction.  The femoral   head was removed without difficulty or complication.  Traction was let   off and retractors were placed posterior and anterior around the   acetabulum.      The labrum and foveal tissue were debrided.  I began reaming with a 21mm   reamer and reamed up to 21mm reamer with good bony bed preparation and a 73mm   cup was chosen.  The final 70mm Pinnacle  cup was then impacted under fluoroscopy  to confirm the depth of penetration and orientation with respect to   abduction.  A screw was placed followed by the hole eliminator.  The final   32+4 neutral Altrex liner was impacted with good visualized rim fit.  The cup was positioned anatomically within the acetabular portion of the pelvis.      At this point, the femur was rolled at 80 degrees.  Further capsule was   released off the inferior aspect of the femoral neck.  I then   released the superior capsule proximally.  The hook was placed laterally   along the femur and elevated manually and held in position with the bed   hook.  The leg was then extended and adducted with the leg rolled to 100   degrees of external rotation.  Once the proximal femur was fully   exposed, I used a box osteotome to set orientation.  I then began   broaching with the starting chili pepper broach and passed this by hand and then broached up to 5.  With the 5 broach in place I chose a high offset neck and did several trial reductions utilizing JointPoint technology to evaluate.  The offset was appropriate, leg lengths   appeared to be equal best matched with the +1 head ball confirmed radiographically.   Given these findings, I went ahead and dislocated the hip, repositioned all   retractors and positioned the right hip in the extended and abducted position.  The final 5 Hi Tri Lock stem was   chosen and it was impacted down to the level of neck cut.  Based on this   and the trial reduction, a 32+1 delta ceramic ball was chosen and   impacted onto a clean and dry trunnion, and the hip was reduced.  The   hip had been irrigated throughout the case again at this point.  I did   reapproximate the superior capsular leaflet to the anterior leaflet   using #1 Vicryl.  The fascia of the   tensor fascia lata muscle was then reapproximated using #1 Vicryl and #0 V-lock V-lock sutures.  The   remaining wound was closed with  2-0 Vicryl and running 4-0 Monocryl.  The hip was cleaned, dried, and dressed sterilely using Dermabond and   Aquacel dressing.  She was then brought   to recovery room in stable condition tolerating the procedure well.    Nehemiah Massed, PA-C was present for the entirety of the case involved from   preoperative positioning, perioperative retractor management, general   facilitation of the case, as well as primary wound closure as assistant.            Pietro Cassis Alvan Dame, M.D.        11/05/2016 1:31 PM

## 2016-11-05 NOTE — Interval H&P Note (Signed)
History and Physical Interval Note:  11/05/2016 10:10 AM  Barb Merino  has presented today for surgery, with the diagnosis of Right hip osteoarthritis   The various methods of treatment have been discussed with the patient and family. After consideration of risks, benefits and other options for treatment, the patient has consented to  Procedure(s): RIGHT TOTAL HIP ARTHROPLASTY ANTERIOR APPROACH (Right) as a surgical intervention .  The patient's history has been reviewed, patient examined, no change in status, stable for surgery.  I have reviewed the patient's chart and labs.  Questions were answered to the patient's satisfaction.     Mauri Pole

## 2016-11-05 NOTE — Anesthesia Preprocedure Evaluation (Addendum)
Anesthesia Evaluation  Patient identified by MRN, date of birth, ID band Patient awake    Reviewed: Allergy & Precautions, NPO status , Patient's Chart, lab work & pertinent test results  Airway Mallampati: II  TM Distance: >3 FB Neck ROM: Full    Dental  (+) Dental Advisory Given, Chipped, Missing,    Pulmonary neg pulmonary ROS,    Pulmonary exam normal breath sounds clear to auscultation       Cardiovascular Exercise Tolerance: Good (-) hypertension(-) angina(-) CAD, (-) Past MI and (-) CHF negative cardio ROS Normal cardiovascular exam Rhythm:Regular Rate:Normal     Neuro/Psych negative neurological ROS  negative psych ROS   GI/Hepatic negative GI ROS, Neg liver ROS,   Endo/Other  Obesity   Renal/GU negative Renal ROS     Musculoskeletal  (+) Arthritis , Osteoarthritis,    Abdominal   Peds  Hematology negative hematology ROS (+) Plt 192k   Anesthesia Other Findings Day of surgery medications reviewed with the patient.  Breast cancer  Reproductive/Obstetrics                            Anesthesia Physical Anesthesia Plan  ASA: II  Anesthesia Plan: Spinal   Post-op Pain Management:    Induction:   PONV Risk Score and Plan: 2 and Ondansetron and Dexamethasone  Airway Management Planned:   Additional Equipment:   Intra-op Plan:   Post-operative Plan:   Informed Consent: I have reviewed the patients History and Physical, chart, labs and discussed the procedure including the risks, benefits and alternatives for the proposed anesthesia with the patient or authorized representative who has indicated his/her understanding and acceptance.   Dental advisory given  Plan Discussed with: CRNA, Anesthesiologist and Surgeon  Anesthesia Plan Comments: (Discussed risks and benefits of and differences between spinal and general. Discussed risks of spinal including headache, backache,  failure, bleeding, infection, and nerve damage. Patient consents to spinal. Questions answered. Coagulation studies and platelet count acceptable.)        Anesthesia Quick Evaluation

## 2016-11-05 NOTE — Interval H&P Note (Signed)
History and Physical Interval Note:  11/05/2016 10:21 AM  [name]  has presented today for surgery, with the diagnosis of Right hip osteoarthritis   The various methods of treatment have been discussed with the patient and family. After consideration of risks, benefits and other options for treatment, the patient has consented to  Procedure(s): RIGHT TOTAL HIP ARTHROPLASTY ANTERIOR APPROACH (Right) as a surgical intervention .  The patient's history has been reviewed, patient examined, no change in status, stable for surgery.  I have reviewed the patient's chart and labs.  Questions were answered to the patient's satisfaction.     Mauri Pole

## 2016-11-05 NOTE — Anesthesia Procedure Notes (Signed)
Spinal  Patient location during procedure: OR Start time: 11/05/2016 11:59 AM Staffing Anesthesiologist: Catalina Gravel Resident/CRNA: [place] Madolyn Ackroyd C Performed: resident/CRNA  Preanesthetic Checklist Completed: patient identified, site marked, surgical consent, pre-op evaluation, timeout performed, IV checked, risks and benefits discussed and monitors and equipment checked Spinal Block Patient position: sitting Prep: Betadine Patient monitoring: heart rate, continuous pulse ox and blood pressure Location: L3-4 Injection technique: single-shot Needle Needle type: Sprotte  Needle gauge: 24 G Needle length: 9 cm Assessment Sensory level: T6 Additional Notes Expiration date of kit checked and confirmed. Patient tolerated procedure well, without complications.

## 2016-11-06 DIAGNOSIS — E669 Obesity, unspecified: Secondary | ICD-10-CM | POA: Diagnosis present

## 2016-11-06 LAB — BASIC METABOLIC PANEL
ANION GAP: 8 (ref 5–15)
BUN: 15 mg/dL (ref 6–20)
CO2: 20 mmol/L — ABNORMAL LOW (ref 22–32)
CREATININE: 0.83 mg/dL (ref 0.44–1.00)
Calcium: 8.7 mg/dL — ABNORMAL LOW (ref 8.9–10.3)
Chloride: 108 mmol/L (ref 101–111)
GFR calc non Af Amer: >60 mL/min (ref >60)
Glucose, Bld: 143 mg/dL — ABNORMAL HIGH (ref 65–99)
Potassium: 4.3 mmol/L (ref 3.5–5.1)
SODIUM: 136 mmol/L (ref 135–145)

## 2016-11-06 LAB — CBC
HCT: 32.2 % — ABNORMAL LOW (ref 36.0–46.0)
HEMOGLOBIN: 11.2 g/dL — AB (ref 12.0–15.0)
MCH: 33 pg (ref 26.0–34.0)
MCHC: 34.8 g/dL (ref 30.0–36.0)
MCV: 95 fL (ref 78.0–100.0)
Platelets: 156 10*3/uL (ref 150–400)
RBC: 3.39 MIL/uL — ABNORMAL LOW (ref 3.87–5.11)
RDW: 13 % (ref 11.5–15.5)
WBC: 12.3 10*3/uL — AB (ref 4.0–10.5)

## 2016-11-06 NOTE — Progress Notes (Signed)
     Subjective: 1 Day Post-Op Procedure(s) (LRB): RIGHT TOTAL HIP ARTHROPLASTY ANTERIOR APPROACH (Right)   Patient reports pain as mild, pain controlled. No events throughout the night.  Dr. Alvan Dame reviewed home exercises to work on strengthening of the right leg.  Ready to be discharged home.   Objective:   VITALS:   Vitals:   11/06/16 0200 11/06/16 0639  BP: 117/61 122/64  Pulse: 67 72  Resp: 15 16  Temp: (!) 97.4 F (36.3 C) 97.8 F (36.6 C)  SpO2: 99% 98%    Dorsiflexion/Plantar flexion intact Incision: dressing C/D/I No cellulitis present Compartment soft  LABS  Recent Labs  11/06/16 0538  HGB 11.2*  HCT 32.2*  WBC 12.3*  PLT 156     Recent Labs  11/06/16 0538  NA 136  K 4.3  BUN 15  CREATININE 0.83  GLUCOSE 143*     Assessment/Plan: 1 Day Post-Op Procedure(s) (LRB): RIGHT TOTAL HIP ARTHROPLASTY ANTERIOR APPROACH (Right) Foley cath d/c'ed Advance diet Up with therapy D/C IV fluids Discharge home Follow up in 2 weeks at [facility]. Follow up with OLIN,Christeena Krogh D in 2 weeks.  Contact information:  [facility] [address] [place] [phone]    Obese (BMI 30-39.9) Estimated body mass index is 33.48 kg/m as calculated from the following:   Height as of this encounter: 5' 3.5" (1.613 m).   Weight as of this encounter: 87.1 kg (192 lb). Patient also counseled that weight may inhibit the healing process Patient counseled that losing weight will help with future health issues         West Pugh. Aviendha Azbell   PAC  11/06/2016, 7:57 AM

## 2016-11-06 NOTE — Evaluation (Signed)
Occupational Therapy Evaluation Patient Details Name: [name] MRN: [id number] DOB: 02-26-1946 Today's Date: 11/06/2016    History of Present Illness 71 yo female s/p R THA-direct anterior 11/05/16.   Clinical Impression   Pt was admitted for the above sx. All education was completed. No further OT is needed at this time.    Follow Up Recommendations  No OT follow up.  Recommend initial 24/7--pt does not want this.  Family lives next door   Equipment Recommendations  None recommended by OT    Recommendations for Other Services       Precautions / Restrictions Precautions Precautions: Fall Restrictions Weight Bearing Restrictions: No RLE Weight Bearing: Weight bearing as tolerated      Mobility Bed Mobility Overal bed mobility: Needs Assistance Bed Mobility: Supine to Sit     Supine to sit: Min assist;HOB elevated     General bed mobility comments: oob by PT  Transfers Overall transfer level: Needs assistance Equipment used: Rolling walker (2 wheeled) Transfers: Sit to/from Stand Sit to Stand: Min guard         General transfer comment: close guard for safety. VCs hand placement    Balance                                           ADL either performed or assessed with clinical judgement   ADL Overall ADL's : Needs assistance/impaired     Grooming: Supervision/safety;Standing   Upper Body Bathing: Set up;Sitting   Lower Body Bathing: Minimal assistance;Sit to/from stand   Upper Body Dressing : Set up;Sitting   Lower Body Dressing: Moderate assistance;Sit to/from stand   Toilet Transfer: Min guard;Ambulation;BSC;RW   Toileting- Water quality scientist and Hygiene: Min guard;Sit to/from stand   Tub/ Shower Transfer: Walk-in shower;Min guard;Ambulation;3 in 1     General ADL Comments: Pt plans to stay on her own. Son and grandson next door and can help as needed.  Reviewed 3;1 can be used as a shower seat and placed right  behind her for increased safety.  Pt has a reacher, but she was able to don underwear without this.  Family will assist with socks/ted hose.  Pt verbalizes to have non-skid socks on top of ted hose when standing/walking     Vision         Perception     Praxis      Pertinent Vitals/Pain Pain Assessment: 0-10 Pain Score: 5  Pain Location: R hip Pain Descriptors / Indicators: Sore Pain Intervention(s): Monitored during session;Repositioned;Ice applied     Hand Dominance     Extremity/Trunk Assessment Upper Extremity Assessment Upper Extremity Assessment: Overall WFL for tasks assessed      Cervical / Trunk Assessment Cervical / Trunk Assessment: Normal   Communication Communication Communication: No difficulties   Cognition Arousal/Alertness: Awake/alert Behavior During Therapy: WFL for tasks assessed/performed Overall Cognitive Status: Within Functional Limits for tasks assessed                                     General Comments       Exercises    Shoulder Instructions      Home Living Family/patient expects to be discharged to:: Private residence Living Arrangements: Alone Available Help at Discharge: Family (son during the day) Type of Home: [place]  Access: Stairs to enter [employer] of Steps: 2-3 Entrance Stairs-Rails: None Home Layout: One level;Laundry or work area in basement     [employer] Shower/Tub: Occupational psychologist: Handicapped height     Home Equipment: Kasandra Knudsen - single point;Walker - 2 wheels;Toilet riser;Tub bench;Shower seat   Additional Comments: son and grandson next door      Prior Functioning/Environment Level of Independence: Independent                 OT Problem List:        OT Treatment/Interventions:      OT Goals(Current goals can be found in the care plan section) Acute Rehab OT Goals Patient Stated Goal: home. regain PLOF.  OT Goal Formulation: All assessment and  education complete, DC therapy  OT Frequency:     Barriers to D/C:            Co-evaluation              AM-PAC PT "6 Clicks" Daily Activity     Outcome Measure Help from another person eating meals?: None Help from another person taking care of personal grooming?: A Little Help from another person toileting, which includes using toliet, bedpan, or urinal?: A Little Help from another person bathing (including washing, rinsing, drying)?: A Little Help from another person to put on and taking off regular upper body clothing?: A Little Help from another person to put on and taking off regular lower body clothing?: A Lot 6 Click Score: 18   End of Session    Activity Tolerance: Patient tolerated treatment well Patient left: in chair;with call bell/phone within reach  OT Visit Diagnosis: Pain Pain - Right/Left: Right Pain - part of body: Hip                Time: 1194-1740 OT Time Calculation (min): 27 min Charges:  OT General Charges $OT Visit: 1 Procedure OT Evaluation $OT Eval Low Complexity: 1 Procedure OT Treatments $Self Care/Home Management : 8-22 mins G-Codes:     [place], OTR/L [phone] 11/06/2016  Sonyia Muro 11/06/2016, 12:09 PM

## 2016-11-06 NOTE — Progress Notes (Signed)
Physical Therapy Treatment Patient Details Name: [name] MRN: [id number] DOB: 28-Jun-1945 Today's Date: 11/06/2016    History of Present Illness 71 yo female s/p R THA-direct anterior 11/05/16.    PT Comments    2nd session to practice stair negotiation and review rest of exercises. Issued HEP for pt to perform 2x/day until MD instructs her otherwise. All education completed. Ready to d/c from PT standpoint-made RN aware.    Follow Up Recommendations  No PT follow up;Supervision - Intermittent     Equipment Recommendations  None recommended by PT    Recommendations for Other Services       Precautions / Restrictions Precautions Precautions: Fall Restrictions Weight Bearing Restrictions: No RLE Weight Bearing: Weight bearing as tolerated    Mobility  Bed Mobility               General bed mobility comments: oob  Transfers Overall transfer level: Needs assistance Equipment used: Rolling walker (2 wheeled) Transfers: Sit to/from Stand Sit to Stand: Supervision         General transfer comment: for safety. VCs hand placement  Ambulation/Gait Ambulation/Gait assistance: Min guard Ambulation Distance (Feet): 100 Feet Assistive device: Rolling walker (2 wheeled) Gait Pattern/deviations: Step-to pattern;Step-through pattern;Decreased stride length     General Gait Details: slow gait speed. close guard for safety.    Stairs Stairs: Yes Min guard assist Stair Management: Step to pattern;Forwards;One rail Left;With cane Number of Stairs: 3 General stair comments: VCs safety, sequence, technique. close guard for safety.   Wheelchair Mobility    Modified Rankin (Stroke Patients Only)       Balance                                            Cognition Arousal/Alertness: Awake/alert Behavior During Therapy: WFL for tasks assessed/performed Overall Cognitive Status: Within Functional Limits for tasks assessed                                        Exercises Total Joint Exercises Hip ABduction/ADduction: AROM;Right;10 reps;Standing Long Arc Quad: AROM;Right;10 reps;Standing Knee Flexion: AROM;Right;10 reps;Standing Marching in Standing: AROM;Both;10 reps;Standing General Exercises - Lower Extremity Heel Raises: AROM;Both;10 reps;Standing    General Comments        Pertinent Vitals/Pain Pain Assessment: 0-10 Pain Score: 5  Pain Location: R hip Pain Descriptors / Indicators: Sore Pain Intervention(s): Monitored during session;Repositioned    Home Living                      Prior Function            PT Goals (current goals can now be found in the care plan section) Progress towards PT goals: Progressing toward goals    Frequency    7X/week      PT Plan Current plan remains appropriate    Co-evaluation              AM-PAC PT "6 Clicks" Daily Activity  Outcome Measure  Difficulty turning over in bed (including adjusting bedclothes, sheets and blankets)?: A Little Difficulty moving from lying on back to sitting on the side of the bed? : A Little Difficulty sitting down on and standing up from a chair with arms (e.g., wheelchair, bedside commode, etc,.)?: A  Little Help needed moving to and from a bed to chair (including a wheelchair)?: A Little Help needed walking in hospital room?: A Little Help needed climbing 3-5 steps with a railing? : A Little 6 Click Score: 18    End of Session Equipment Utilized During Treatment: Gait belt Activity Tolerance: Patient tolerated treatment well Patient left:  (in bathroom to get dressed-son waiting in room)   PT Visit Diagnosis: Muscle weakness (generalized) (M62.81);Difficulty in walking, not elsewhere classified (R26.2)     Time: 5638-7564 PT Time Calculation (min) (ACUTE ONLY): 18 min  Charges:  $Gait Training: 8-22 mins                    G Codes:          Weston Anna, MPT Pager: [phone]

## 2016-11-06 NOTE — Evaluation (Signed)
Physical Therapy Evaluation Patient Details Name: [name] MRN: [id number] DOB: 07-26-1945 Today's Date: 11/06/2016   History of Present Illness  71 yo female s/p R THA-direct anterior 11/05/16.  Clinical Impression  On eval, pt required Min guard-Min assist for mobility. She walked ~115 feet with a RW. Pain rated 5/10 with activity. Anticipate pt will progress well during hospital stay. Will plan to have a 2nd session prior to d/c later this afternoon    Follow Up Recommendations No PT follow up;Supervision - Intermittent    Equipment Recommendations  None recommended by PT    Recommendations for Other Services       Precautions / Restrictions Precautions Precautions: Fall Restrictions Weight Bearing Restrictions: No RLE Weight Bearing: Weight bearing as tolerated      Mobility  Bed Mobility Overal bed mobility: Needs Assistance Bed Mobility: Supine to Sit     Supine to sit: Min assist;HOB elevated     General bed mobility comments: Assist for R LE.   Transfers Overall transfer level: Needs assistance Equipment used: Rolling walker (2 wheeled) Transfers: Sit to/from Stand Sit to Stand: Min guard         General transfer comment: close guard for safety. VCs hand placement  Ambulation/Gait Ambulation/Gait assistance: Min guard Ambulation Distance (Feet): 115 Feet Assistive device: Rolling walker (2 wheeled) Gait Pattern/deviations: Step-to pattern;Step-through pattern;Decreased stride length     General Gait Details: slow gait speed. close guard for safety.   Stairs            Wheelchair Mobility    Modified Rankin (Stroke Patients Only)       Balance                                             Pertinent Vitals/Pain Pain Assessment: 0-10 Pain Score: 5  Pain Location: R hip Pain Descriptors / Indicators: Sore Pain Intervention(s): Monitored during session;Repositioned;Ice applied    Home Living Family/patient  expects to be discharged to:: (P) Private residence Living Arrangements: Alone Available Help at Discharge: Family (son during the day) Type of Home: House Home Access: Stairs to enter Entrance Stairs-Rails: None Entrance Stairs-Number of Steps: 2-3 Home Layout: One level;Laundry or work area in [place]: Kasandra Knudsen - single point;Walker - 2 wheels;Toilet riser;Tub bench      Prior Function Level of Independence: Independent               Hand Dominance        Extremity/Trunk Assessment   Upper Extremity Assessment Upper Extremity Assessment: Defer to OT evaluation    Lower Extremity Assessment Lower Extremity Assessment: Generalized weakness (s/p R THA)    Cervical / Trunk Assessment Cervical / Trunk Assessment: Normal  Communication   Communication: No difficulties  Cognition Arousal/Alertness: Awake/alert Behavior During Therapy: WFL for tasks assessed/performed Overall Cognitive Status: Within Functional Limits for tasks assessed                                        General Comments      Exercises Total Joint Exercises Ankle Circles/Pumps: AROM;Both;10 reps;Supine Quad Sets: AROM;Both;10 reps;Supine Heel Slides: AAROM;Right;10 reps;Supine Hip ABduction/ADduction: AAROM;Right;10 reps;Supine   Assessment/Plan    PT Assessment Patient needs continued PT services  PT Problem List Decreased strength;Decreased mobility;Decreased  range of motion;Decreased activity tolerance;Decreased balance;Decreased knowledge of use of DME;Pain       PT Treatment Interventions DME instruction;Therapeutic activities;Gait training;Therapeutic exercise;Patient/family education;Balance training;Stair training;Functional mobility training    PT Goals (Current goals can be found in the Care Plan section)  Acute Rehab PT Goals Patient Stated Goal: home. regain PLOF.  PT Goal Formulation: With patient Time For Goal Achievement: 11/20/16 Potential  to Achieve Goals: Good    Frequency 7X/week   Barriers to discharge        Co-evaluation               AM-PAC PT "6 Clicks" Daily Activity  Outcome Measure Difficulty turning over in bed (including adjusting bedclothes, sheets and blankets)?: A Lot Difficulty moving from lying on back to sitting on the side of the bed? : Unable Difficulty sitting down on and standing up from a chair with arms (e.g., wheelchair, bedside commode, etc,.)?: A Little Help needed moving to and from a bed to chair (including a wheelchair)?: A Little Help needed walking in hospital room?: A Little Help needed climbing 3-5 steps with a railing? : A Little 6 Click Score: 15    End of Session Equipment Utilized During Treatment: Gait belt Activity Tolerance: Patient tolerated treatment well Patient left: in chair;with call bell/phone within reach   PT Visit Diagnosis: Muscle weakness (generalized) (M62.81);Difficulty in walking, not elsewhere classified (R26.2)    Time: 2035-5974 PT Time Calculation (min) (ACUTE ONLY): 38 min   Charges:   PT Evaluation $PT Eval Low Complexity: 1 Low PT Treatments $Gait Training: 8-22 mins $Therapeutic Exercise: 8-22 mins   PT G Codes:          Weston Anna, MPT Pager: [phone]

## 2016-11-06 NOTE — Progress Notes (Signed)
Discharge planning, no HH needs identified. No PT planned, home exercises only, has DME. [phone]

## 2016-11-11 NOTE — Discharge Summary (Signed)
Physician Discharge Summary  Patient ID: [name] MRN: [id number] DOB/AGE: 72-15-1947 72 y.o.  Admit date: 11/05/2016 Discharge date: 11/06/2016   Procedures:  Procedure(s) (LRB): RIGHT TOTAL HIP ARTHROPLASTY ANTERIOR APPROACH (Right)  Attending Physician:  Dr. Paralee Cancel   Admission Diagnoses:   Right hip primary OA / pain  Discharge Diagnoses:  Principal Problem:   S/P right THA, AA Active Problems:   Obese  Past Medical History:  Diagnosis Date  . Cancer [facility]) 1991   Breast    HPI:    [name], 71 y.o. female, has a history of pain and functional disability in the right hip(s) due to arthritis and patient has failed non-surgical conservative treatments for greater than 12 weeks to include NSAID's and/or analgesics, use of assistive devices and activity modification.  Onset of symptoms was gradual starting 2+ years ago with gradually worsening course since that time.The patient noted no past surgery on the right hip(s).  Patient currently rates pain in the right hip at 10 out of 10 with activity. Patient has night pain, worsening of pain with activity and weight bearing, trendelenberg gait, pain that interfers with activities of daily living and pain with passive range of motion. Patient has evidence of periarticular osteophytes and joint space narrowing by imaging studies. This condition presents safety issues increasing the risk of falls. There is no current active infection.   Risks, benefits and expectations were discussed with the patient.  Risks including but not limited to the risk of anesthesia, blood clots, nerve damage, blood vessel damage, failure of the prosthesis, infection and up to and including death.  Patient understand the risks, benefits and expectations and wishes to proceed with surgery.   PCP: Redmond School, MD   Discharged Condition: good  Hospital Course:  Patient underwent the above stated procedure on 11/05/2016. Patient tolerated the  procedure well and brought to the recovery room in good condition and subsequently to the floor.  POD #1 BP: 122/64 ; Pulse: 72 ; Temp: 97.8 F (36.6 C) ; Resp: 16 Patient reports pain as mild, pain controlled. No events throughout the night.  Dr. Alvan Dame reviewed home exercises to work on strengthening of the right leg.  Ready to be discharged home.  Dorsiflexion/plantar flexion intact, incision: dressing C/D/I, no cellulitis present and compartment soft.   LABS  Basename    HGB     11.2  HCT     32.2    Discharge Exam: General appearance: alert, cooperative and no distress Extremities: Homans sign is negative, no sign of DVT, no edema, redness or tenderness in the calves or thighs and no ulcers, gangrene or trophic changes  Disposition: Home with follow up in 2 weeks   Follow-up Information    Paralee Cancel, MD. Schedule an appointment as soon as possible for a visit in 2 week(s).   Specialty:  Orthopedic Surgery Contact information: [address] [place] [address] [phone]           Discharge Instructions    Call MD / Call 911    Complete by:  As directed    If you experience chest pain or shortness of breath, CALL 911 and be transported to the hospital emergency room.  If you develope a fever above 101 F, pus (white drainage) or increased drainage or redness at the wound, or calf pain, call your surgeon's office.   Change dressing    Complete by:  As directed    Maintain surgical  dressing until follow up in the clinic. If the edges start to pull up, may reinforce with tape. If the dressing is no longer working, may remove and cover with gauze and tape, but must keep the area dry and clean.  Call with any questions or concerns.   Constipation Prevention    Complete by:  As directed    Drink plenty of fluids.  Prune juice may be helpful.  You may use a stool softener, such as Colace (over the counter) 100 mg twice a day.  Use MiraLax (over the  counter) for constipation as needed.   Diet - low sodium heart healthy    Complete by:  As directed    Discharge instructions    Complete by:  As directed    Maintain surgical dressing until follow up in the clinic. If the edges start to pull up, may reinforce with tape. If the dressing is no longer working, may remove and cover with gauze and tape, but must keep the area dry and clean.  Follow up in 2 weeks at [facility]. Call with any questions or concerns.   Increase activity slowly as tolerated    Complete by:  As directed    Weight bearing as tolerated with assist device (walker, cane, etc) as directed, use it as long as suggested by your surgeon or therapist, typically at least 4-6 weeks.   TED hose    Complete by:  As directed    Use stockings (TED hose) for 2 weeks on both leg(s).  You may remove them at night for sleeping.      Allergies as of 11/06/2016   No Known Allergies     Medication List    STOP taking these medications   naproxen sodium 220 MG tablet Commonly known as:  ANAPROX   TYLENOL 8 HOUR ARTHRITIS PAIN 650 MG CR tablet Generic drug:  acetaminophen     TAKE these medications   aspirin 81 MG chewable tablet Commonly known as:  ASPIRIN CHILDRENS Chew 1 tablet (81 mg total) by mouth 2 (two) times daily. Take for 4 weeks.   diphenhydrAMINE 25 mg capsule Commonly known as:  BENADRYL Take 50 mg by mouth at bedtime.   docusate sodium 100 MG capsule Commonly known as:  COLACE Take 1 capsule (100 mg total) by mouth 2 (two) times daily.   ferrous sulfate 325 (65 FE) MG tablet Commonly known as:  FERROUSUL Take 1 tablet (325 mg total) by mouth 3 (three) times daily with meals.   HYDROcodone-acetaminophen 7.5-325 MG tablet Commonly known as:  NORCO Take 1-2 tablets by mouth every 4 (four) hours as needed for moderate pain or severe pain.   Krill Oil 300 MG Caps Take 300 mg by mouth daily.   methocarbamol 500 MG tablet Commonly known as:   ROBAXIN Take 1 tablet (500 mg total) by mouth every 6 (six) hours as needed for muscle spasms.   polyethylene glycol packet Commonly known as:  MIRALAX / GLYCOLAX Take 17 g by mouth 2 (two) times daily.   vitamin B-12 1000 MCG tablet Commonly known as:  CYANOCOBALAMIN Take 1,000 mcg by mouth daily.   Vitamin D3 2000 units Tabs Take 2,000 Units by mouth daily.            Discharge Care Instructions        Start     Ordered   11/06/16 0000  aspirin (ASPIRIN CHILDRENS) 81 MG chewable tablet  2 times daily  Question:  Supervising Provider  Answer:  Paralee Cancel   11/05/16 1226   11/06/16 0000  Call MD / Call 911    Comments:  If you experience chest pain or shortness of breath, CALL 911 and be transported to the hospital emergency room.  If you develope a fever above 101 F, pus (white drainage) or increased drainage or redness at the wound, or calf pain, call your surgeon's office.   11/06/16 0758   11/06/16 0000  Discharge instructions    Comments:  Maintain surgical dressing until follow up in the clinic. If the edges start to pull up, may reinforce with tape. If the dressing is no longer working, may remove and cover with gauze and tape, but must keep the area dry and clean.  Follow up in 2 weeks at [facility]. Call with any questions or concerns.   11/06/16 0758   11/06/16 0000  Diet - low sodium heart healthy     11/06/16 0758   11/06/16 0000  Constipation Prevention    Comments:  Drink plenty of fluids.  Prune juice may be helpful.  You may use a stool softener, such as Colace (over the counter) 100 mg twice a day.  Use MiraLax (over the counter) for constipation as needed.   11/06/16 0758   11/06/16 0000  Increase activity slowly as tolerated    Comments:  Weight bearing as tolerated with assist device (walker, cane, etc) as directed, use it as long as suggested by your surgeon or therapist, typically at least 4-6 weeks.   11/06/16 0758   11/06/16 0000   Change dressing    Comments:  Maintain surgical dressing until follow up in the clinic. If the edges start to pull up, may reinforce with tape. If the dressing is no longer working, may remove and cover with gauze and tape, but must keep the area dry and clean.  Call with any questions or concerns.   11/06/16 0758   11/06/16 0000  TED hose    Comments:  Use stockings (TED hose) for 2 weeks on both leg(s).  You may remove them at night for sleeping.   11/06/16 0758   11/05/16 0000  docusate sodium (COLACE) 100 MG capsule  2 times daily    Question:  Supervising Provider  Answer:  Paralee Cancel   11/05/16 1226   11/05/16 0000  ferrous sulfate (FERROUSUL) 325 (65 FE) MG tablet  3 times daily with meals    Question:  Supervising Provider  Answer:  Paralee Cancel   11/05/16 1226   11/05/16 0000  polyethylene glycol (MIRALAX / GLYCOLAX) packet  2 times daily    Question:  Supervising Provider  Answer:  Alvan Dame, Silas Sedam   11/05/16 1226   11/05/16 0000  methocarbamol (ROBAXIN) 500 MG tablet  Every 6 hours PRN    Question:  Supervising Provider  Answer:  Paralee Cancel   11/05/16 1226   11/05/16 0000  HYDROcodone-acetaminophen (NORCO) 7.5-325 MG tablet  Every 4 hours PRN    Question:  Supervising Provider  Answer:  Paralee Cancel   11/05/16 1226       Signed: West Pugh. Harvey Lingo   PA-C  11/11/2016, 12:21 PM

## 2016-11-21 DIAGNOSIS — Z96641 Presence of right artificial hip joint: Secondary | ICD-10-CM | POA: Diagnosis not present

## 2016-12-18 DIAGNOSIS — Z96641 Presence of right artificial hip joint: Secondary | ICD-10-CM | POA: Diagnosis not present

## 2016-12-18 DIAGNOSIS — Z471 Aftercare following joint replacement surgery: Secondary | ICD-10-CM | POA: Diagnosis not present

## 2017-01-29 DIAGNOSIS — Z96641 Presence of right artificial hip joint: Secondary | ICD-10-CM | POA: Diagnosis not present

## 2017-01-29 DIAGNOSIS — Z471 Aftercare following joint replacement surgery: Secondary | ICD-10-CM | POA: Diagnosis not present

## 2017-06-25 ENCOUNTER — Other Ambulatory Visit (HOSPITAL_COMMUNITY): Payer: Self-pay | Admitting: Internal Medicine

## 2017-06-25 DIAGNOSIS — Z1231 Encounter for screening mammogram for malignant neoplasm of breast: Secondary | ICD-10-CM

## 2017-07-02 ENCOUNTER — Ambulatory Visit (HOSPITAL_COMMUNITY)
Admission: RE | Admit: 2017-07-02 | Discharge: 2017-07-02 | Disposition: A | Discharge status: Home / Self Care | Payer: Medicare HMO | Source: Ambulatory Visit | Attending: Internal Medicine | Admitting: Internal Medicine

## 2017-07-02 ENCOUNTER — Encounter (HOSPITAL_COMMUNITY): Payer: Self-pay

## 2017-07-02 DIAGNOSIS — Z1231 Encounter for screening mammogram for malignant neoplasm of breast: Secondary | ICD-10-CM | POA: Diagnosis not present

## 2017-07-02 HISTORY — DX: Malignant neoplasm of unspecified site of unspecified female breast: C50.919

## 2017-07-02 HISTORY — DX: Personal history of irradiation: Z92.3

## 2017-10-03 DIAGNOSIS — L659 Nonscarring hair loss, unspecified: Secondary | ICD-10-CM | POA: Diagnosis not present

## 2017-10-03 DIAGNOSIS — Z1389 Encounter for screening for other disorder: Secondary | ICD-10-CM | POA: Diagnosis not present

## 2017-10-03 DIAGNOSIS — R7309 Other abnormal glucose: Secondary | ICD-10-CM | POA: Diagnosis not present

## 2017-10-03 DIAGNOSIS — Z0001 Encounter for general adult medical examination with abnormal findings: Secondary | ICD-10-CM | POA: Diagnosis not present

## 2017-10-03 DIAGNOSIS — M1991 Primary osteoarthritis, unspecified site: Secondary | ICD-10-CM | POA: Diagnosis not present

## 2017-10-03 DIAGNOSIS — E782 Mixed hyperlipidemia: Secondary | ICD-10-CM | POA: Diagnosis not present

## 2017-10-03 DIAGNOSIS — Z6835 Body mass index (BMI) 35.0-35.9, adult: Secondary | ICD-10-CM | POA: Diagnosis not present

## 2017-10-05 DIAGNOSIS — Z1211 Encounter for screening for malignant neoplasm of colon: Secondary | ICD-10-CM | POA: Diagnosis not present

## 2017-10-30 DIAGNOSIS — M1611 Unilateral primary osteoarthritis, right hip: Secondary | ICD-10-CM | POA: Diagnosis not present

## 2017-11-06 DIAGNOSIS — Z1283 Encounter for screening for malignant neoplasm of skin: Secondary | ICD-10-CM | POA: Diagnosis not present

## 2017-11-06 DIAGNOSIS — L821 Other seborrheic keratosis: Secondary | ICD-10-CM | POA: Diagnosis not present

## 2018-04-03 DIAGNOSIS — H524 Presbyopia: Secondary | ICD-10-CM | POA: Diagnosis not present

## 2018-04-03 DIAGNOSIS — H52201 Unspecified astigmatism, right eye: Secondary | ICD-10-CM | POA: Diagnosis not present

## 2018-08-20 ENCOUNTER — Other Ambulatory Visit (HOSPITAL_COMMUNITY): Payer: Self-pay | Admitting: Internal Medicine

## 2018-08-20 DIAGNOSIS — Z1231 Encounter for screening mammogram for malignant neoplasm of breast: Secondary | ICD-10-CM

## 2018-10-08 ENCOUNTER — Other Ambulatory Visit: Payer: Self-pay

## 2018-10-08 ENCOUNTER — Ambulatory Visit (HOSPITAL_COMMUNITY)
Admission: RE | Admit: 2018-10-08 | Discharge: 2018-10-08 | Disposition: A | Discharge status: Home / Self Care | Payer: Medicare HMO | Source: Ambulatory Visit | Attending: Internal Medicine | Admitting: Internal Medicine

## 2018-10-08 DIAGNOSIS — Z1231 Encounter for screening mammogram for malignant neoplasm of breast: Secondary | ICD-10-CM | POA: Diagnosis not present

## 2019-09-03 ENCOUNTER — Other Ambulatory Visit (HOSPITAL_COMMUNITY): Payer: Self-pay | Admitting: Family Medicine

## 2019-09-03 DIAGNOSIS — Z1231 Encounter for screening mammogram for malignant neoplasm of breast: Secondary | ICD-10-CM

## 2019-10-11 ENCOUNTER — Other Ambulatory Visit: Payer: Self-pay

## 2019-10-11 ENCOUNTER — Ambulatory Visit (HOSPITAL_COMMUNITY)
Admission: RE | Admit: 2019-10-11 | Discharge: 2019-10-11 | Disposition: A | Discharge status: Home / Self Care | Payer: Medicare HMO | Source: Ambulatory Visit | Attending: Family Medicine | Admitting: Family Medicine

## 2019-10-11 DIAGNOSIS — Z1231 Encounter for screening mammogram for malignant neoplasm of breast: Secondary | ICD-10-CM | POA: Insufficient documentation

## 2020-09-20 ENCOUNTER — Other Ambulatory Visit (HOSPITAL_COMMUNITY): Payer: Self-pay | Admitting: Family Medicine

## 2020-09-20 ENCOUNTER — Other Ambulatory Visit (HOSPITAL_COMMUNITY): Payer: Self-pay | Admitting: Internal Medicine

## 2020-09-20 DIAGNOSIS — Z1231 Encounter for screening mammogram for malignant neoplasm of breast: Secondary | ICD-10-CM

## 2020-10-13 ENCOUNTER — Ambulatory Visit (HOSPITAL_COMMUNITY): Payer: Medicare HMO

## 2020-10-18 ENCOUNTER — Ambulatory Visit (HOSPITAL_COMMUNITY)
Admission: RE | Admit: 2020-10-18 | Discharge: 2020-10-18 | Disposition: A | Discharge status: Home / Self Care | Payer: Medicare HMO | Source: Ambulatory Visit | Attending: Family Medicine | Admitting: Family Medicine

## 2020-10-18 ENCOUNTER — Other Ambulatory Visit: Payer: Self-pay

## 2020-10-18 DIAGNOSIS — Z1231 Encounter for screening mammogram for malignant neoplasm of breast: Secondary | ICD-10-CM | POA: Insufficient documentation

## 2021-09-10 ENCOUNTER — Ambulatory Visit (INDEPENDENT_AMBULATORY_CARE_PROVIDER_SITE_OTHER): Payer: Medicare HMO | Admitting: Nurse Practitioner

## 2021-09-10 ENCOUNTER — Encounter: Payer: Self-pay | Admitting: Nurse Practitioner

## 2021-09-10 VITALS — BP 124/63 | HR 77 | Temp 97.7°F | Ht 63.0 in | Wt 184.4 lb

## 2021-09-10 DIAGNOSIS — G8929 Other chronic pain: Secondary | ICD-10-CM

## 2021-09-10 DIAGNOSIS — M25561 Pain in right knee: Secondary | ICD-10-CM | POA: Diagnosis not present

## 2021-09-10 DIAGNOSIS — Z13220 Encounter for screening for lipoid disorders: Secondary | ICD-10-CM

## 2021-09-10 DIAGNOSIS — M25562 Pain in left knee: Secondary | ICD-10-CM

## 2021-09-10 DIAGNOSIS — N898 Other specified noninflammatory disorders of vagina: Secondary | ICD-10-CM

## 2021-09-10 DIAGNOSIS — N949 Unspecified condition associated with female genital organs and menstrual cycle: Secondary | ICD-10-CM | POA: Diagnosis not present

## 2021-09-10 DIAGNOSIS — Z131 Encounter for screening for diabetes mellitus: Secondary | ICD-10-CM

## 2021-09-10 LAB — POCT URINALYSIS DIPSTICK
Bilirubin, UA: NEGATIVE
Blood, UA: NEGATIVE
Glucose, UA: NEGATIVE
Ketones, UA: NEGATIVE
Leukocytes, UA: NEGATIVE
Nitrite, UA: NEGATIVE
Protein, UA: NEGATIVE
Spec Grav, UA: >=1.03 — AB (ref 1.010–1.025)
Urobilinogen, UA: 0.2 E.U./dL
pH, UA: 6 (ref 5.0–8.0)

## 2021-09-11 LAB — CBC WITH DIFFERENTIAL/PLATELET
Basophils Absolute: 0.1 10*3/uL (ref 0.0–0.2)
Basos: 1 %
EOS (ABSOLUTE): 0.2 10*3/uL (ref 0.0–0.4)
Eos: 3 %
Hematocrit: 42.1 % (ref 34.0–46.6)
Hemoglobin: 14.5 g/dL (ref 11.1–15.9)
Immature Grans (Abs): 0 10*3/uL (ref 0.0–0.1)
Immature Granulocytes: 1 %
Lymphocytes Absolute: 2.5 10*3/uL (ref 0.7–3.1)
Lymphs: 31 %
MCH: 33.2 pg — ABNORMAL HIGH (ref 26.6–33.0)
MCHC: 34.4 g/dL (ref 31.5–35.7)
MCV: 96 fL (ref 79–97)
Monocytes Absolute: 0.5 10*3/uL (ref 0.1–0.9)
Monocytes: 7 %
Neutrophils Absolute: 4.5 10*3/uL (ref 1.4–7.0)
Neutrophils: 57 %
Platelets: 216 10*3/uL (ref 150–450)
RBC: 4.37 x10E6/uL (ref 3.77–5.28)
RDW: 12 % (ref 11.7–15.4)
WBC: 7.8 10*3/uL (ref 3.4–10.8)

## 2021-09-11 LAB — HEMOGLOBIN A1C
Est. average glucose Bld gHb Est-mCnc: 105 mg/dL
Hgb A1c MFr Bld: 5.3 % (ref 4.8–5.6)

## 2021-09-11 LAB — CMP14+EGFR
ALT: 23 IU/L (ref 0–32)
AST: 27 IU/L (ref 0–40)
Albumin/Globulin Ratio: 1.8 (ref 1.2–2.2)
Albumin: 4.7 g/dL (ref 3.7–4.7)
Alkaline Phosphatase: 70 IU/L (ref 44–121)
BUN/Creatinine Ratio: 16 (ref 12–28)
BUN: 16 mg/dL (ref 8–27)
Bilirubin Total: 0.8 mg/dL (ref 0.0–1.2)
CO2: 21 mmol/L (ref 20–29)
Calcium: 9.8 mg/dL (ref 8.7–10.3)
Chloride: 101 mmol/L (ref 96–106)
Creatinine, Ser: 1.01 mg/dL — ABNORMAL HIGH (ref 0.57–1.00)
Globulin, Total: 2.6 g/dL (ref 1.5–4.5)
Glucose: 96 mg/dL (ref 70–99)
Potassium: 4.1 mmol/L (ref 3.5–5.2)
Sodium: 139 mmol/L (ref 134–144)
Total Protein: 7.3 g/dL (ref 6.0–8.5)
eGFR: 57 mL/min/{1.73_m2} — ABNORMAL LOW (ref >59)

## 2021-09-11 LAB — SPECIMEN STATUS REPORT

## 2021-09-11 LAB — LIPID PANEL
Chol/HDL Ratio: 4.8 ratio — ABNORMAL HIGH (ref 0.0–4.4)
Cholesterol, Total: 268 mg/dL — ABNORMAL HIGH (ref 100–199)
HDL: 56 mg/dL (ref >39)
LDL Chol Calc (NIH): 174 mg/dL — ABNORMAL HIGH (ref 0–99)
Triglycerides: 205 mg/dL — ABNORMAL HIGH (ref 0–149)
VLDL Cholesterol Cal: 38 mg/dL (ref 5–40)

## 2021-09-15 LAB — VAGINAL YEAST CULTURE: Vaginal Yeast Culture: NEGATIVE

## 2021-09-17 ENCOUNTER — Other Ambulatory Visit (HOSPITAL_COMMUNITY): Payer: Self-pay | Admitting: Nurse Practitioner

## 2021-09-17 ENCOUNTER — Ambulatory Visit (HOSPITAL_COMMUNITY)
Admission: RE | Admit: 2021-09-17 | Discharge: 2021-09-17 | Disposition: A | Discharge status: Home / Self Care | Payer: Medicare HMO | Source: Ambulatory Visit | Attending: Nurse Practitioner | Admitting: Nurse Practitioner

## 2021-09-17 DIAGNOSIS — N898 Other specified noninflammatory disorders of vagina: Secondary | ICD-10-CM | POA: Insufficient documentation

## 2021-09-17 DIAGNOSIS — N949 Unspecified condition associated with female genital organs and menstrual cycle: Secondary | ICD-10-CM | POA: Diagnosis present

## 2021-09-17 DIAGNOSIS — Z1231 Encounter for screening mammogram for malignant neoplasm of breast: Secondary | ICD-10-CM

## 2021-09-25 ENCOUNTER — Ambulatory Visit: Payer: Medicare HMO | Admitting: Orthopaedic Surgery

## 2021-09-27 ENCOUNTER — Other Ambulatory Visit (HOSPITAL_COMMUNITY)
Admission: RE | Admit: 2021-09-27 | Discharge: 2021-09-27 | Disposition: A | Discharge status: Home / Self Care | Payer: Medicare HMO | Source: Ambulatory Visit | Attending: Obstetrics & Gynecology | Admitting: Obstetrics & Gynecology

## 2021-09-27 ENCOUNTER — Ambulatory Visit (INDEPENDENT_AMBULATORY_CARE_PROVIDER_SITE_OTHER): Payer: Medicare HMO | Admitting: Obstetrics & Gynecology

## 2021-09-27 ENCOUNTER — Encounter: Payer: Self-pay | Admitting: Obstetrics & Gynecology

## 2021-09-27 VITALS — BP 140/80 | Ht 63.0 in | Wt 187.0 lb

## 2021-09-27 DIAGNOSIS — N763 Subacute and chronic vulvitis: Secondary | ICD-10-CM | POA: Insufficient documentation

## 2021-09-27 NOTE — Progress Notes (Signed)
Patient ID: [name], female   DOB: 1946-03-06, 76 y.o.   MRN: [id number]  Chief Complaint  Patient presents with   Vaginal Pain    Burning also has a possible cystocele.    HPI [name] is a 76 y.o. female.   HPI Indication: hypertrophic, erythematous lesion of the vulva Symptoms:   burning for 1 year  Location:  labia majora on the right  Past Medical History:  Diagnosis Date   Breast cancer ([place])    Cancer ([place]) 1991   Breast   Personal history of radiation therapy     Past Surgical History:  Procedure Laterality Date   BREAST LUMPECTOMY Left    FOOT SURGERY  2006   JOINT REPLACEMENT     TOTAL HIP ARTHROPLASTY Right 11/05/2016   Procedure: RIGHT TOTAL HIP ARTHROPLASTY ANTERIOR APPROACH;  Surgeon: Paralee Cancel, MD;  Location: [facility];  Service: Orthopedics;  Laterality: Right;    No family history on file.  Social History Social History   Tobacco Use   Smoking status: Never   Smokeless tobacco: Never  Substance Use Topics   Alcohol use: No   Drug use: No    No Known Allergies  Current Outpatient Medications  Medication Sig Dispense Refill   Cholecalciferol (VITAMIN D3) 2000 units TABS Take 2,000 Units by mouth daily.      diphenhydrAMINE (BENADRYL) 25 mg capsule Take 50 mg by mouth at bedtime.     Krill Oil 300 MG CAPS Take 300 mg by mouth daily.      Multiple Vitamin (MULTIVITAMIN) tablet Take 1 tablet by mouth daily.     vitamin B-12 (CYANOCOBALAMIN) 1000 MCG tablet Take 1,000 mcg by mouth daily.     docusate sodium (COLACE) 100 MG capsule Take 1 capsule (100 mg total) by mouth 2 (two) times daily. (Patient not taking: Reported on 09/10/2021) 10 capsule 0   ferrous sulfate (FERROUSUL) 325 (65 FE) MG tablet Take 1 tablet (325 mg total) by mouth 3 (three) times daily with meals. (Patient not taking: Reported on 09/10/2021)  3   HYDROcodone-acetaminophen (NORCO) 7.5-325 MG tablet Take 1-2 tablets by mouth every 4 (four) hours as needed for moderate pain  or severe pain. (Patient not taking: Reported on 09/10/2021) 60 tablet 0   methocarbamol (ROBAXIN) 500 MG tablet Take 1 tablet (500 mg total) by mouth every 6 (six) hours as needed for muscle spasms. (Patient not taking: Reported on 09/10/2021) 40 tablet 0   polyethylene glycol (MIRALAX / GLYCOLAX) packet Take 17 g by mouth 2 (two) times daily. (Patient not taking: Reported on 09/10/2021) 14 each 0   No current facility-administered medications for this visit.    Review of Systems Review of Systems  Height '5\' 3"'$  (1.6 m), weight 187 lb (84.8 kg).  Physical Exam Physical Exam  Data Reviewed   Assessment    Prepping with Betadine Local anesthesia with 1% Buffered Lidocaine 4  mm punch biopsy performed per protocol Figure of 8 3-0 ethilon suture placed Well tolerated  Specimen appropriately identified and sent to pathology    Plan    Follow-up:  2 weeks for suture removal I will call when biopsy results return and make treatment plan at that time      Florian Buff 09/27/2021, 11:11 AM

## 2021-10-02 ENCOUNTER — Ambulatory Visit (INDEPENDENT_AMBULATORY_CARE_PROVIDER_SITE_OTHER): Payer: Medicare HMO

## 2021-10-02 ENCOUNTER — Ambulatory Visit (INDEPENDENT_AMBULATORY_CARE_PROVIDER_SITE_OTHER): Payer: Medicare HMO | Admitting: Orthopaedic Surgery

## 2021-10-02 VITALS — BP 152/94 | HR 100 | Ht 64.0 in | Wt 185.6 lb

## 2021-10-02 DIAGNOSIS — M79604 Pain in right leg: Secondary | ICD-10-CM

## 2021-10-02 DIAGNOSIS — M545 Low back pain, unspecified: Secondary | ICD-10-CM

## 2021-10-02 DIAGNOSIS — M62561 Muscle wasting and atrophy, not elsewhere classified, right lower leg: Secondary | ICD-10-CM | POA: Diagnosis not present

## 2021-10-02 NOTE — Progress Notes (Signed)
Subjective:    Patient ID: [name], female    DOB: 01/02/1946, 76 y.o.   MRN: [id number]  HPI She has noticed the right lower extremity is smaller in size than the left lower extremity. She has had bilateral total knees done in 2007 and 2008 and a total hip in 2018 on the right.  She has been a patient of [facility] but wants to avoid driving to [place].  She has had long history of lower back pain and right sided sciatica. She has numbness that goes to the right foot.  She says she has had this problem for so long, she has gotten used to it and has accepted whatever pain she has and does not worry about it....until now when she noticed the size difference of the circumference of her legs and thighs.  She has no known weakness, no trauma.    She has no history of CVA.   Review of Systems  Constitutional:  Positive for activity change.  Musculoskeletal:  Positive for arthralgias and back pain.  All other systems reviewed and are negative. For Review of Systems, all other systems reviewed and are negative.  The following is a summary of the past history medically, past history surgically, known current medicines, social history and family history.  This information is gathered electronically by the computer from prior information and documentation.  I review this each visit and have found including this information at this point in the chart is beneficial and informative.   Past Medical History:  Diagnosis Date   Breast cancer ([place])    Cancer ([place]) 1991   Breast   Personal history of radiation therapy     Past Surgical History:  Procedure Laterality Date   BREAST LUMPECTOMY Left    FOOT SURGERY  2006   JOINT REPLACEMENT     TOTAL HIP ARTHROPLASTY Right 11/05/2016   Procedure: RIGHT TOTAL HIP ARTHROPLASTY ANTERIOR APPROACH;  Surgeon: Paralee Cancel, MD;  Location: [facility];  Service: Orthopedics;  Laterality: Right;    Current Outpatient Medications on File Prior to Visit   Medication Sig Dispense Refill   Cholecalciferol (VITAMIN D3) 2000 units TABS Take 2,000 Units by mouth daily.      diphenhydrAMINE (BENADRYL) 25 mg capsule Take 50 mg by mouth at bedtime.     HYDROcodone-acetaminophen (NORCO) 7.5-325 MG tablet Take 1-2 tablets by mouth every 4 (four) hours as needed for moderate pain or severe pain. 60 tablet 0   Krill Oil 300 MG CAPS Take 300 mg by mouth daily.      Multiple Vitamin (MULTIVITAMIN) tablet Take 1 tablet by mouth daily.     vitamin B-12 (CYANOCOBALAMIN) 1000 MCG tablet Take 1,000 mcg by mouth daily.     docusate sodium (COLACE) 100 MG capsule Take 1 capsule (100 mg total) by mouth 2 (two) times daily. (Patient not taking: Reported on 09/10/2021) 10 capsule 0   ferrous sulfate (FERROUSUL) 325 (65 FE) MG tablet Take 1 tablet (325 mg total) by mouth 3 (three) times daily with meals. (Patient not taking: Reported on 09/10/2021)  3   methocarbamol (ROBAXIN) 500 MG tablet Take 1 tablet (500 mg total) by mouth every 6 (six) hours as needed for muscle spasms. (Patient not taking: Reported on 09/10/2021) 40 tablet 0   polyethylene glycol (MIRALAX / GLYCOLAX) packet Take 17 g by mouth 2 (two) times daily. (Patient not taking: Reported on 09/10/2021) 14 each 0   No current facility-administered medications on file prior  to visit.    Social History   Socioeconomic History   Marital status: Widowed    Spouse name: Not on file   Number of children: Not on file   Years of education: Not on file   Highest education level: Not on file  Occupational History   Not on file  Tobacco Use   Smoking status: Never   Smokeless tobacco: Never  Substance and Sexual Activity   Alcohol use: No   Drug use: No   Sexual activity: Not on file  Other Topics Concern   Not on file  Social History Narrative   Not on file   Social Determinants of Health   Financial Resource Strain: Low Risk  (09/27/2021)   Overall Financial Resource Strain (CARDIA)    Difficulty of  Paying Living Expenses: Not hard at all  Food Insecurity: No Food Insecurity (09/27/2021)   Hunger Vital Sign    Worried About Running Out of Food in the Last Year: Never true    [place] in the Last Year: Never true  Transportation Needs: No Transportation Needs (09/27/2021)   PRAPARE - Hydrologist (Medical): No    Lack of Transportation (Non-Medical): No  Physical Activity: Insufficiently Active (09/27/2021)   Exercise Vital Sign    Days of Exercise per Week: 5 days    Minutes of Exercise per Session: 20 min  Stress: No Stress Concern Present (09/27/2021)   [place]    Feeling of Stress : Not at all  Social Connections: Unknown (09/27/2021)   Social Connection and Isolation Panel [NHANES]    Frequency of Communication with Friends and Family: More than three times a week    Frequency of Social Gatherings with Friends and Family: Once a week    Attends Religious Services: Patient refused    Active Member of Clubs or Organizations: No    Attends Archivist Meetings: Never    Marital Status: Widowed  Intimate Partner Violence: Not At Risk (09/27/2021)   Humiliation, Afraid, Rape, and Kick questionnaire    Fear of Current or Ex-Partner: No    Emotionally Abused: No    Physically Abused: No    Sexually Abused: No    No family history on file.  BP (!) 152/94   Pulse 100   Ht '5\' 4"'$  (1.626 m)   Wt 185 lb 9.6 oz (84.2 kg)   BMI 31.86 kg/m   Body mass index is 31.86 kg/m.      Objective:   Physical Exam Vitals and nursing note reviewed. Exam conducted with a chaperone present.  Constitutional:      Appearance: She is well-developed.  HENT:     Head: Normocephalic and atraumatic.  Eyes:     Conjunctiva/sclera: Conjunctivae normal.     Pupils: Pupils are equal, round, and reactive to light.  Cardiovascular:     Rate and Rhythm: Normal rate and regular rhythm.   Pulmonary:     Effort: Pulmonary effort is normal.  Abdominal:     Palpations: Abdomen is soft.  Musculoskeletal:     Cervical back: Normal range of motion and neck supple.       Legs:  Skin:    General: Skin is warm and dry.  Neurological:     Mental Status: She is alert and oriented to person, place, and time.     Cranial Nerves: No cranial nerve deficit.  Motor: No abnormal muscle tone.     Coordination: Coordination normal.     Deep Tendon Reflexes: Reflexes are normal and symmetric. Reflexes normal.  Psychiatric:        Behavior: Behavior normal.        Thought Content: Thought content normal.        Judgment: Judgment normal.   X-rays of lumbar spine were done, reported separately.        Assessment & Plan:   Encounter Diagnoses  Name Primary?   Pain of right lower extremity Yes   Lumbar pain    Atrophy of muscle of right lower leg    I will get MRI of lumbar spine.  She may need EMGs.  Return in two weeks.  Call if any problem.  Precautions discussed.  Electronically Signed Sanjuana Kava, MD 7/18/20232:17 PM

## 2021-10-02 NOTE — Patient Instructions (Signed)
While we are working on your approval please go ahead and call to schedule your appointment with [facility] for at least a week from today so we can precert your imaging.    Central Scheduling [phone]

## 2021-10-03 LAB — SURGICAL PATHOLOGY

## 2021-10-04 ENCOUNTER — Ambulatory Visit: Payer: Medicare HMO | Admitting: Nurse Practitioner

## 2021-10-15 ENCOUNTER — Encounter: Payer: Self-pay | Admitting: Obstetrics & Gynecology

## 2021-10-15 ENCOUNTER — Ambulatory Visit (INDEPENDENT_AMBULATORY_CARE_PROVIDER_SITE_OTHER): Payer: Medicare HMO | Admitting: Obstetrics & Gynecology

## 2021-10-15 VITALS — BP 136/82

## 2021-10-15 DIAGNOSIS — L280 Lichen simplex chronicus: Secondary | ICD-10-CM | POA: Diagnosis not present

## 2021-10-15 MED ORDER — FLUOCINONIDE EMULSIFIED BASE 0.05 % EX CREA: 1.0000 | TOPICAL_CREAM | Freq: Two times a day (BID) | CUTANEOUS | 11 refills | Status: DC

## 2021-10-15 NOTE — Progress Notes (Signed)
Follow up appointment for results: Vulvar biopsy  Chief Complaint  Patient presents with   Follow-up    Suture removal    Blood pressure 136/82.  Suture removed without difficulty  Bx: no dysplasia or cancer Lichenoid changes planus vs sclerosus  MEDS ordered this encounter: Meds ordered this encounter  Medications   fluocinonide-emollient (LIDEX-E) 0.05 % cream    Sig: Apply 1 Application topically 2 (two) times daily.    Dispense:  15 g    Refill:  11    Orders for this encounter: No orders of the defined types were placed in this encounter.   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     All questions were answered.  Past Medical History:  Diagnosis Date   Breast cancer ([place])    Cancer ([place]) 1991   Breast   Personal history of radiation therapy     Past Surgical History:  Procedure Laterality Date   BREAST LUMPECTOMY Left    FOOT SURGERY  2006   JOINT REPLACEMENT     TOTAL HIP ARTHROPLASTY Right 11/05/2016   Procedure: RIGHT TOTAL HIP ARTHROPLASTY ANTERIOR APPROACH;  Surgeon: Paralee Cancel, MD;  Location: [facility];  Service: Orthopedics;  Laterality: Right;    OB History   No obstetric history on file.     No Known Allergies  Social History   Socioeconomic History   Marital status: Widowed    Spouse name: Not on file   Number of children: Not on file   Years of education: Not on file   Highest education level: Not on file  Occupational History   Not on file  Tobacco Use   Smoking status: Never   Smokeless tobacco: Never  Substance and Sexual Activity   Alcohol use: No   Drug use: No   Sexual activity: Not on file  Other Topics Concern   Not on file  Social History Narrative   Not on file   Social Determinants of Health   Financial Resource Strain: Low Risk  (09/27/2021)   Overall  Financial Resource Strain (CARDIA)    Difficulty of Paying Living Expenses: Not hard at all  Food Insecurity: No Food Insecurity (09/27/2021)   Hunger Vital Sign    Worried About Running Out of Food in the Last Year: Never true    [place] in the Last Year: Never true  Transportation Needs: No Transportation Needs (09/27/2021)   PRAPARE - Hydrologist (Medical): No    Lack of Transportation (Non-Medical): No  Physical Activity: Insufficiently Active (09/27/2021)   Exercise Vital Sign    Days of Exercise per Week: 5 days    Minutes of Exercise per Session: 20 min  Stress: No Stress Concern Present (09/27/2021)   [place]    Feeling of Stress : Not at all  Social Connections: Unknown (09/27/2021)   Social Connection and Isolation Panel [NHANES]    Frequency of Communication with Friends and Family: More than three times a week    Frequency of Social Gatherings with Friends and Family: Once a week    Attends Religious Services: Patient refused    Active Member of Clubs or Organizations: No    Attends Archivist Meetings: Never  Marital Status: Widowed    History reviewed. No pertinent family history.

## 2021-10-17 ENCOUNTER — Other Ambulatory Visit (HOSPITAL_COMMUNITY): Payer: Medicare HMO

## 2021-10-18 ENCOUNTER — Encounter: Payer: Self-pay | Admitting: Nurse Practitioner

## 2021-10-18 ENCOUNTER — Ambulatory Visit (INDEPENDENT_AMBULATORY_CARE_PROVIDER_SITE_OTHER): Payer: Medicare HMO | Admitting: Nurse Practitioner

## 2021-10-18 VITALS — BP 138/78 | HR 79 | Ht 63.0 in | Wt 185.2 lb

## 2021-10-18 DIAGNOSIS — M25562 Pain in left knee: Secondary | ICD-10-CM

## 2021-10-18 DIAGNOSIS — G8929 Other chronic pain: Secondary | ICD-10-CM

## 2021-10-18 DIAGNOSIS — M25561 Pain in right knee: Secondary | ICD-10-CM

## 2021-10-18 DIAGNOSIS — L280 Lichen simplex chronicus: Secondary | ICD-10-CM | POA: Diagnosis not present

## 2021-10-18 DIAGNOSIS — R7989 Other specified abnormal findings of blood chemistry: Secondary | ICD-10-CM | POA: Diagnosis not present

## 2021-10-18 NOTE — Progress Notes (Signed)
   Subjective:    Patient ID: [name], female    DOB: 07-28-1945, 76 y.o.   MRN: [id number]  HPI  Follow up for HTN.   Patient not currently on hypertension medication.  Blood pressure today 138/78. Patient denies any chest pain, difficulty breathing, shortness of breath, leg or ankle swelling, headaches or changes to vision.  Patient recently seen by GYN for vaginal burning and was diagnosed with Vulva lichenoid dermatitis and prescribed Lidex-e for symptoms.  Patient states that she does not currently experience any burning to her vaginal area.  Patient also recently seen by Ortho for possible muscle wasting to her right leg. Patient states that she has an MRI on Monday for further evaluation.  Patient has no other concerns.  Review of Systems  All other systems reviewed and are negative.      Objective:   Physical Exam Vitals reviewed.  Constitutional:      General: She is not in acute distress.    Appearance: Normal appearance. She is normal weight. She is not ill-appearing, toxic-appearing or diaphoretic.  HENT:     Head: Normocephalic and atraumatic.  Cardiovascular:     Rate and Rhythm: Normal rate and regular rhythm.     Pulses: Normal pulses.     Heart sounds: Normal heart sounds. No murmur heard. Pulmonary:     Effort: Pulmonary effort is normal. No respiratory distress.     Breath sounds: Normal breath sounds. No wheezing.  Musculoskeletal:     Comments: Grossly intact  Skin:    General: Skin is warm.     Capillary Refill: Capillary refill takes less than 2 seconds.  Neurological:     Mental Status: She is alert.     Comments: Grossly intact  Psychiatric:        Mood and Affect: Mood normal.        Behavior: Behavior normal.        Assessment & Plan:   1. Elevated serum creatinine -Creatinine was 1.01 during last labs.  Patient was encouraged to drink plenty of water and have lab retested. -Lab order was placed today. - CMP14+EGFR -Return to  clinic 2 months for follow-up  2. Vulva Lichenoid dermatitis -Patient continues to be followed by GYN.  Keep appointments as scheduled  3. Chronic pain of both knees/muscle wasting -Patient continues to be followed by orthopedic specialist.  Keep appointments as scheduled.     Note:  This document was prepared using Dragon voice recognition software and may include unintentional dictation errors. Note - This record has been created using [employer].  Chart creation errors have been sought, but may not always  have been located. Such creation errors do not reflect on  the standard of medical care.

## 2021-10-22 ENCOUNTER — Ambulatory Visit (HOSPITAL_COMMUNITY)
Admission: RE | Admit: 2021-10-22 | Discharge: 2021-10-22 | Disposition: A | Discharge status: Home / Self Care | Payer: Medicare HMO | Source: Ambulatory Visit | Attending: Nurse Practitioner | Admitting: Nurse Practitioner

## 2021-10-22 ENCOUNTER — Ambulatory Visit (HOSPITAL_COMMUNITY)
Admission: RE | Admit: 2021-10-22 | Discharge: 2021-10-22 | Disposition: A | Discharge status: Home / Self Care | Payer: Medicare HMO | Source: Ambulatory Visit | Attending: Orthopaedic Surgery | Admitting: Orthopaedic Surgery

## 2021-10-22 DIAGNOSIS — M545 Low back pain, unspecified: Secondary | ICD-10-CM

## 2021-10-22 DIAGNOSIS — M62561 Muscle wasting and atrophy, not elsewhere classified, right lower leg: Secondary | ICD-10-CM | POA: Insufficient documentation

## 2021-10-22 DIAGNOSIS — M79604 Pain in right leg: Secondary | ICD-10-CM

## 2021-10-22 DIAGNOSIS — Z1231 Encounter for screening mammogram for malignant neoplasm of breast: Secondary | ICD-10-CM | POA: Insufficient documentation

## 2021-10-23 ENCOUNTER — Ambulatory Visit: Payer: Medicare HMO | Admitting: Orthopaedic Surgery

## 2021-10-23 LAB — CMP14+EGFR
ALT: 18 IU/L (ref 0–32)
AST: 20 IU/L (ref 0–40)
Albumin/Globulin Ratio: 2 (ref 1.2–2.2)
Albumin: 4.5 g/dL (ref 3.8–4.8)
Alkaline Phosphatase: 68 IU/L (ref 44–121)
BUN/Creatinine Ratio: 18 (ref 12–28)
BUN: 17 mg/dL (ref 8–27)
Bilirubin Total: 0.6 mg/dL (ref 0.0–1.2)
CO2: 24 mmol/L (ref 20–29)
Calcium: 9.8 mg/dL (ref 8.7–10.3)
Chloride: 100 mmol/L (ref 96–106)
Creatinine, Ser: 0.94 mg/dL (ref 0.57–1.00)
Globulin, Total: 2.3 g/dL (ref 1.5–4.5)
Glucose: 90 mg/dL (ref 70–99)
Potassium: 4.9 mmol/L (ref 3.5–5.2)
Sodium: 139 mmol/L (ref 134–144)
Total Protein: 6.8 g/dL (ref 6.0–8.5)
eGFR: 62 mL/min/{1.73_m2} (ref >59)

## 2021-10-24 ENCOUNTER — Ambulatory Visit: Payer: Medicare HMO | Admitting: Orthopaedic Surgery

## 2021-10-30 ENCOUNTER — Ambulatory Visit (INDEPENDENT_AMBULATORY_CARE_PROVIDER_SITE_OTHER): Payer: Medicare HMO | Admitting: Orthopaedic Surgery

## 2021-10-30 ENCOUNTER — Encounter: Payer: Self-pay | Admitting: Orthopaedic Surgery

## 2021-10-30 DIAGNOSIS — M545 Low back pain, unspecified: Secondary | ICD-10-CM

## 2021-10-30 DIAGNOSIS — M62561 Muscle wasting and atrophy, not elsewhere classified, right lower leg: Secondary | ICD-10-CM | POA: Diagnosis not present

## 2021-10-30 NOTE — Progress Notes (Signed)
My leg still hurts some.  She has atrophy of right lower leg, thigh, and has lower back pain.  She had MRI of the lumbar spine which showed: IMPRESSION: 1. L4-L5 severe bilateral neural foraminal narrowing, which has progressed on the right. 2. L3-L4 moderate right and mild left neural foraminal narrowing. Effacement of the right lateral recess at this level likely compresses the descending right L4 nerve roots. 3. Narrowing of the lateral recesses at L1-L2, L2-L3, and L4-L5, which could affect the descending L2, L3, and L5 nerve roots, respectively.  I have explained the findings to her.  I have recommended she see a neurosurgeon.  She says she appreciates knowing what is wrong with her but declines at this time to see a neurosurgeon.  I told her she could have injection instead of surgery.  She said she had that done many years ago and it did not help.  She prefers to just wait and see how she feels.  I told her she could call us and I will refer her to the neurosurgeon when she desires.  She is agreeable to this.  She has atrophy of the right thigh and right calf, reflexes are slightly weaker on the right, slight limp on the right, NV intact.  Encounter Diagnoses  Name Primary?   Lumbar pain Yes   Atrophy of muscle of right lower leg    To call as needed to see neurosurgeon per her request.  I have independently reviewed the MRI.    Call if any problem.  Precautions discussed.  Electronically Signed Sanjuana Kava, MD 8/15/202310:32 AM

## 2021-12-10 ENCOUNTER — Telehealth: Payer: Self-pay | Admitting: Radiology

## 2021-12-10 NOTE — Telephone Encounter (Signed)
Patient called, and asked if she could get a note to excuse her from jury duty.  Please advise.

## 2021-12-11 ENCOUNTER — Encounter: Payer: Self-pay | Admitting: Orthopaedic Surgery

## 2021-12-11 NOTE — Telephone Encounter (Signed)
Called patient; requests for note to be mailed, as date for jury duty is 01/14/22.  Done.

## 2021-12-18 ENCOUNTER — Ambulatory Visit (INDEPENDENT_AMBULATORY_CARE_PROVIDER_SITE_OTHER): Payer: Medicare HMO | Admitting: Nurse Practitioner

## 2021-12-18 ENCOUNTER — Encounter: Payer: Self-pay | Admitting: Nurse Practitioner

## 2021-12-18 VITALS — BP 124/80 | Ht 63.0 in | Wt 185.6 lb

## 2021-12-18 DIAGNOSIS — Z0000 Encounter for general adult medical examination without abnormal findings: Secondary | ICD-10-CM

## 2021-12-18 NOTE — Progress Notes (Signed)
   Subjective:    Patient ID: [name], female    DOB: [birth year], 76 y.o.   MRN: [id number]  HPI  AWV- Annual Wellness Visit  The patient was seen for their annual wellness visit. The patient's past medical history, surgical history, and family history were reviewed. Pertinent vaccines were reviewed ( tetanus, pneumonia, shingles, flu) The patient's medication list was reviewed and updated.  The height and weight were entered.  BMI recorded in electronic record elsewhere  Cognitive screening was completed. Outcome of Mini - Cog: pass   Falls /depression screening electronically recorded within record elsewhere  Current tobacco usage:no (All patients who use tobacco were given written and verbal information on quitting)  Recent listing of emergency department/hospitalizations over the past year were reviewed.  current specialist the patient sees on a regular basis: GYN for issue   Medicare annual wellness visit patient questionnaire was reviewed.  A written screening schedule for the patient for the next 5-10 years was given. Appropriate discussion of followup regarding next visit was discussed.     Review of Systems  All other systems reviewed and are negative.      Objective:   Physical Exam Vitals reviewed.  Constitutional:      General: She is not in acute distress.    Appearance: Normal appearance. She is normal weight. She is not ill-appearing, toxic-appearing or diaphoretic.  HENT:     Head: Normocephalic and atraumatic.  Cardiovascular:     Rate and Rhythm: Normal rate and regular rhythm.     Pulses: Normal pulses.     Heart sounds: Normal heart sounds. No murmur heard. Pulmonary:     Effort: Pulmonary effort is normal. No respiratory distress.     Breath sounds: Normal breath sounds. No wheezing.  Musculoskeletal:     Comments: Grossly intact  Skin:    General: Skin is warm.     Capillary Refill: Capillary refill takes less than 2 seconds.   Neurological:     Mental Status: She is alert.     Comments: Grossly intact  Psychiatric:        Mood and Affect: Mood normal.        Behavior: Behavior normal.        Assessment & Plan:   1. Wellness examination Adult wellness-complete.wellness physical was conducted today. Importance of diet and exercise were discussed in detail.  Importance of stress reduction and healthy living were discussed.  In addition to this a discussion regarding safety was also covered.  We also reviewed over immunizations and gave recommendations regarding current immunization needed for age.   In addition to this additional areas were also touched on including: Preventative health exams needed:  Declined flu and COVID vaccine Reports already getting Pneumonia vaccine and Shingrix vaccine -Declines Hep C screening today but willing to get at next schedule lab draw.   Patient was advised yearly wellness exam   Follow up in 3-6 months routine follow up.  Recommend CMP, Lipid, and Hep C labs at that time.

## 2022-01-15 ENCOUNTER — Encounter: Payer: Self-pay | Admitting: Obstetrics & Gynecology

## 2022-01-15 ENCOUNTER — Ambulatory Visit (INDEPENDENT_AMBULATORY_CARE_PROVIDER_SITE_OTHER): Payer: Medicare HMO | Admitting: Obstetrics & Gynecology

## 2022-01-15 VITALS — BP 140/82 | Ht 63.0 in

## 2022-01-15 DIAGNOSIS — N904 Leukoplakia of vulva: Secondary | ICD-10-CM | POA: Insufficient documentation

## 2022-01-15 MED ORDER — FLUOCINONIDE EMULSIFIED BASE 0.05 % EX CREA: 1.0000 | TOPICAL_CREAM | Freq: Two times a day (BID) | CUTANEOUS | 3 refills | Status: DC

## 2022-01-15 NOTE — Progress Notes (Signed)
Follow up appointment for results: Lichen sclerosus management  Chief Complaint  Patient presents with   Follow-up    Blood pressure (!) 140/82, height '5\' 3"'$  (1.6 m).    Pt is significantly improved Using 2 nights per weeks at this point  MEDS ordered this encounter: Meds ordered this encounter  Medications   fluocinonide-emollient (LIDEX-E) 0.05 % cream    Sig: Apply 1 Application topically 2 (two) times daily.    Dispense:  45 g    Refill:  3    Orders for this encounter: No orders of the defined types were placed in this encounter.   Impression + Management Plan   [id number]   1. Lichen sclerosus et atrophicus of the vulva  N90.4    well managed on lidex E twice a week, increase if flares      Follow Up: Return in about 1 year (around 01/16/2023) for Follow up, with Dr Elonda Husky.     All questions were answered.  Past Medical History:  Diagnosis Date   Breast cancer ([place])    Cancer ([place]) 1991   Breast   Personal history of radiation therapy     Past Surgical History:  Procedure Laterality Date   BREAST LUMPECTOMY Left    FOOT SURGERY  2006   JOINT REPLACEMENT     TOTAL HIP ARTHROPLASTY Right 11/05/2016   Procedure: RIGHT TOTAL HIP ARTHROPLASTY ANTERIOR APPROACH;  Surgeon: Paralee Cancel, MD;  Location: [facility];  Service: Orthopedics;  Laterality: Right;    OB History   No obstetric history on file.     No Known Allergies  Social History   Socioeconomic History   Marital status: Widowed    Spouse name: Not on file   Number of children: Not on file   Years of education: Not on file   Highest education level: Not on file  Occupational History   Not on file  Tobacco Use   Smoking status: Never   Smokeless tobacco: Never  Substance and Sexual Activity   Alcohol use: No   Drug use: No   Sexual activity: Not on file  Other Topics Concern   Not on file  Social History Narrative   Not on file   Social Determinants of Health   Financial  Resource Strain: Low Risk  (09/27/2021)   Overall Financial Resource Strain (CARDIA)    Difficulty of Paying Living Expenses: Not hard at all  Food Insecurity: No Food Insecurity (09/27/2021)   Hunger Vital Sign    Worried About Running Out of Food in the Last Year: Never true    [place] in the Last Year: Never true  Transportation Needs: No Transportation Needs (09/27/2021)   PRAPARE - Hydrologist (Medical): No    Lack of Transportation (Non-Medical): No  Physical Activity: Insufficiently Active (09/27/2021)   Exercise Vital Sign    Days of Exercise per Week: 5 days    Minutes of Exercise per Session: 20 min  Stress: No Stress Concern Present (09/27/2021)   [place]    Feeling of Stress : Not at all  Social Connections: Unknown (09/27/2021)   Social Connection and Isolation Panel [NHANES]    Frequency of Communication with Friends and Family: More than three times a week    Frequency of Social Gatherings with Friends and Family: Once a week    Attends Religious Services: Patient refused    Active Member of  Clubs or Organizations: No    Attends Archivist Meetings: Never    Marital Status: Widowed    No family history on file.

## 2022-01-23 NOTE — Addendum Note (Signed)
Addended by: Claire Shown on: 01/23/2022 08:26 AM   Modules accepted: Level of Service

## 2022-02-26 IMAGING — MG MM DIGITAL SCREENING BILAT W/ TOMO AND CAD
6 of 10 series · 6 of 30 positions shown · non-contrast
Comparison: Previous exam(s).

CLINICAL DATA: Screening.

EXAM:
DIGITAL SCREENING BILATERAL MAMMOGRAM WITH TOMOSYNTHESIS AND CAD
TECHNIQUE: Bilateral screening digital craniocaudal and mediolateral oblique
mammograms were obtained. Bilateral screening digital breast
tomosynthesis was performed. The images were evaluated with
computer-aided detection.

[R CC synth-2D]
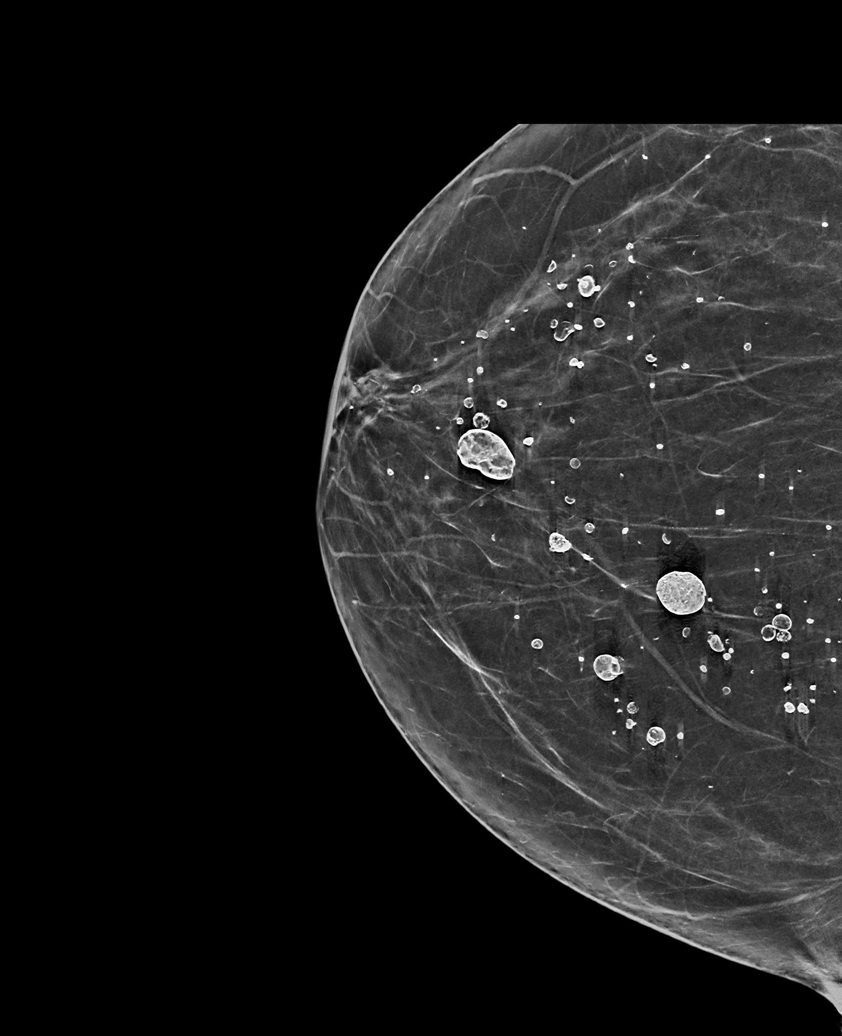

[L MLO synth-2D]
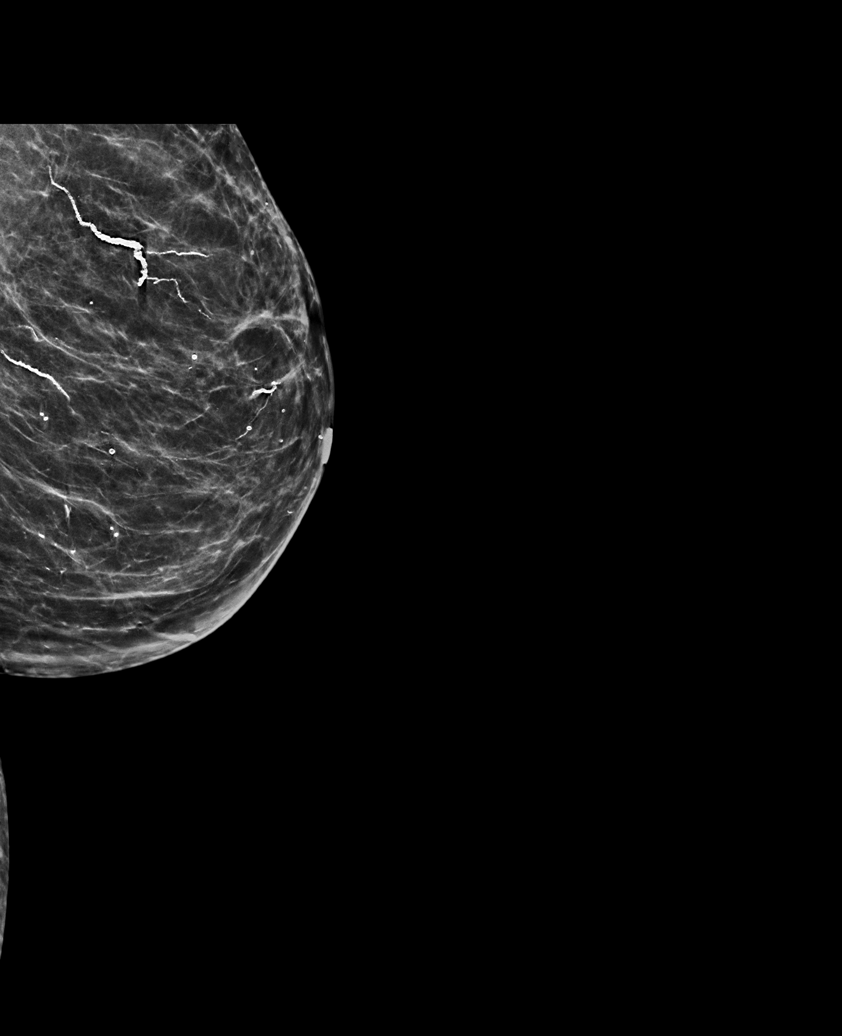

[R MLO synth-2D (1 of 2)]
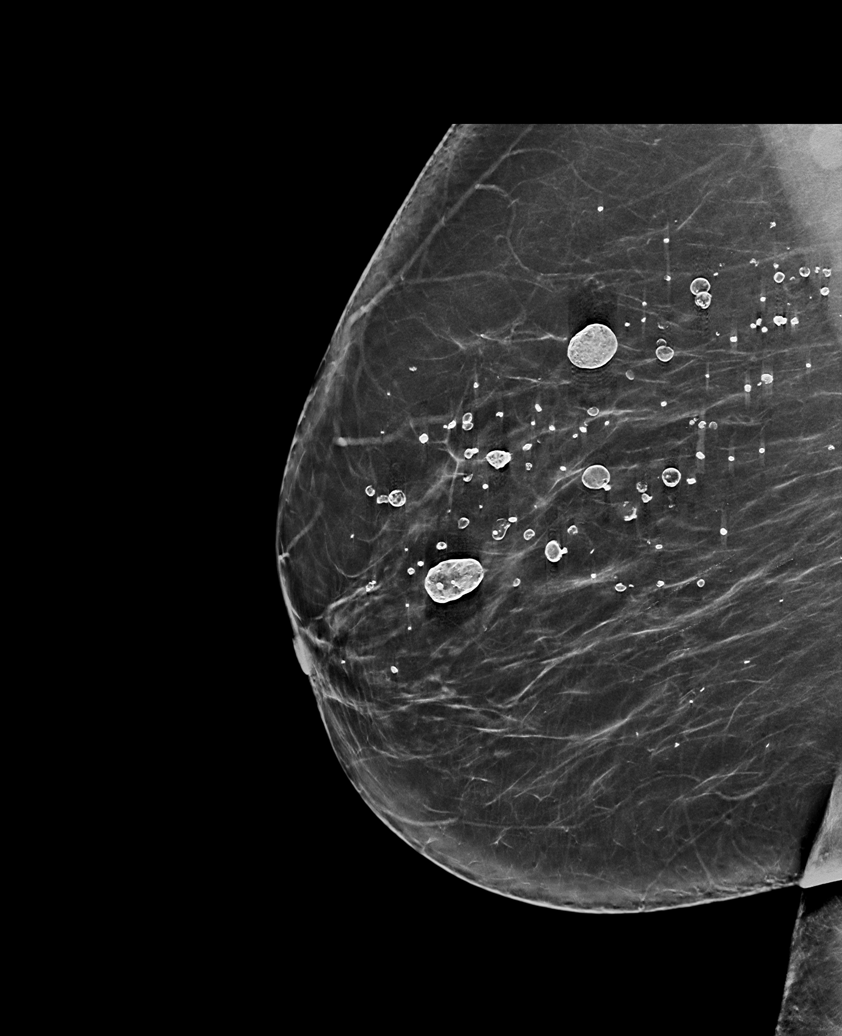

[L CC synth-2D]
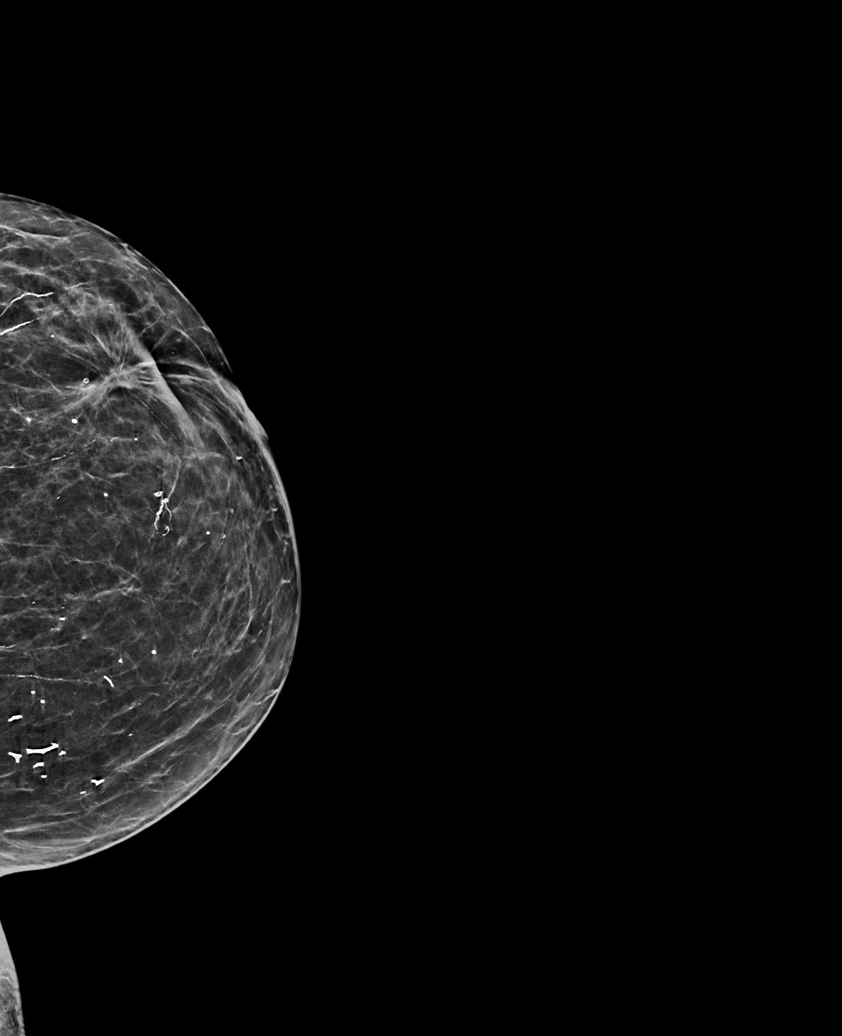

[R MLO synth-2D (2 of 2)]
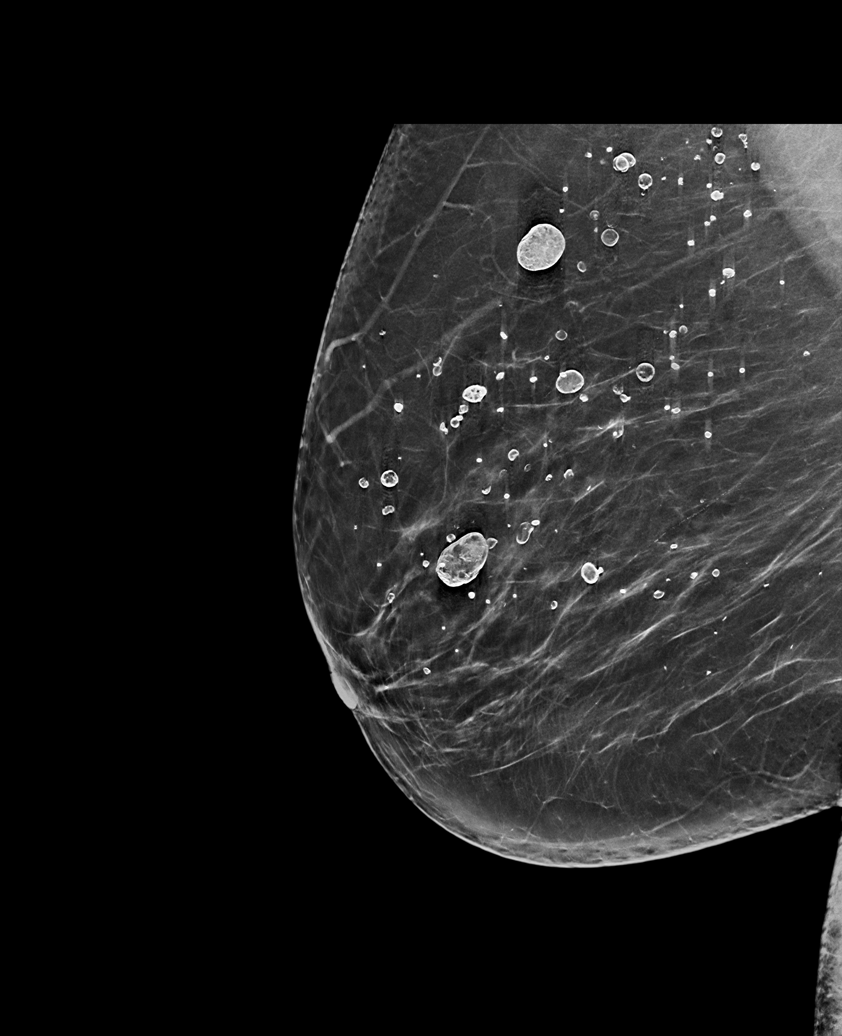

[R MLO tomo · tomo slice 39/78.0]
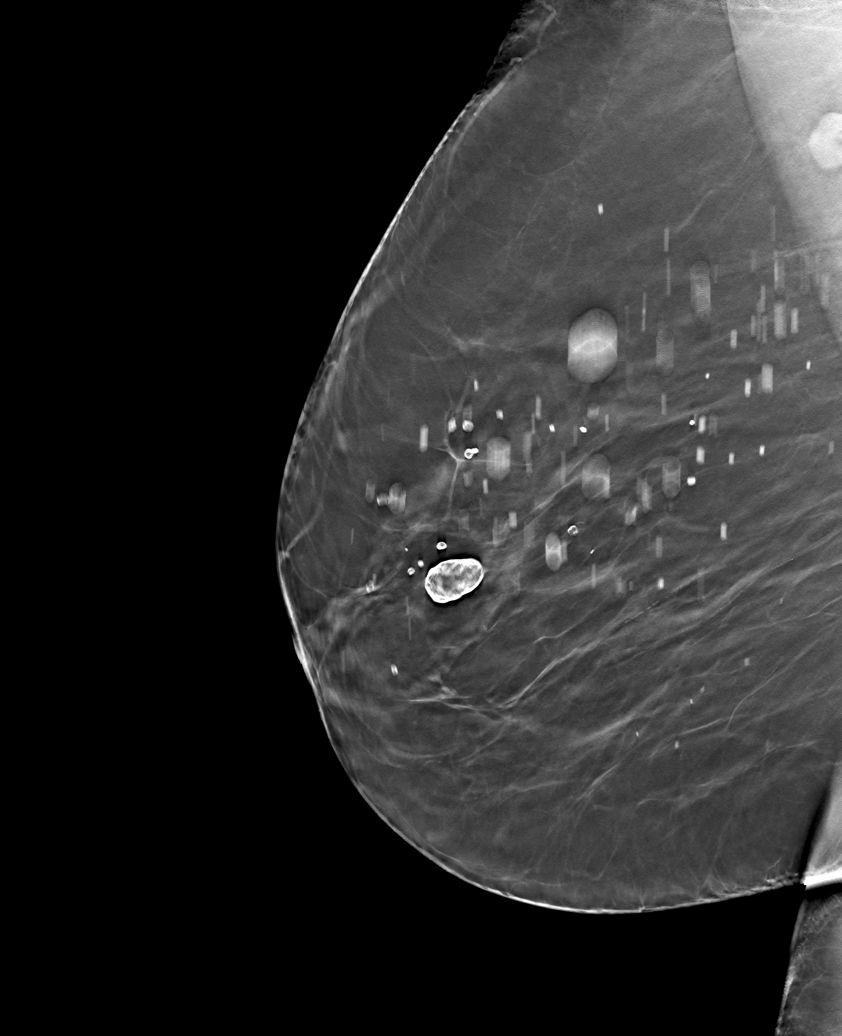

[6 of 30 positions shown; findings below may reference images not displayed]

ACR Breast Density Category b: There are scattered areas of
fibroglandular density.
FINDINGS: There are no findings suspicious for malignancy.
IMPRESSION: No mammographic evidence of malignancy. A result letter of this
screening mammogram will be mailed directly to the patient.

RECOMMENDATION:
Screening mammogram in one year. (Code:51-O-LD2)

BI-RADS CATEGORY  1: Negative.

## 2022-10-09 ENCOUNTER — Ambulatory Visit (INDEPENDENT_AMBULATORY_CARE_PROVIDER_SITE_OTHER): Payer: Medicare HMO | Admitting: Family Medicine

## 2022-10-09 ENCOUNTER — Encounter: Payer: Self-pay | Admitting: Family Medicine

## 2022-10-09 VITALS — BP 136/84 | HR 89 | Temp 97.3°F | Ht 63.0 in | Wt 186.0 lb

## 2022-10-09 DIAGNOSIS — E785 Hyperlipidemia, unspecified: Secondary | ICD-10-CM | POA: Diagnosis not present

## 2022-10-09 DIAGNOSIS — M25511 Pain in right shoulder: Secondary | ICD-10-CM

## 2022-10-09 DIAGNOSIS — Z130 Encounter for screening for diseases of the blood and blood-forming organs and certain disorders involving the immune mechanism: Secondary | ICD-10-CM | POA: Diagnosis not present

## 2022-10-09 DIAGNOSIS — M25512 Pain in left shoulder: Secondary | ICD-10-CM

## 2022-10-09 DIAGNOSIS — G4762 Sleep related leg cramps: Secondary | ICD-10-CM | POA: Diagnosis not present

## 2022-10-09 DIAGNOSIS — G8929 Other chronic pain: Secondary | ICD-10-CM | POA: Insufficient documentation

## 2022-10-09 MED ORDER — BACLOFEN 10 MG PO TABS: 10.0000 mg | ORAL_TABLET | Freq: Every evening | ORAL | 0 refills | Status: DC | PRN

## 2022-10-09 NOTE — Assessment & Plan Note (Signed)
X-rays today to assess for degenerative changes.

## 2022-10-09 NOTE — Assessment & Plan Note (Signed)
Advised stretching.  Advised to stay hydrated.  Baclofen as directed.  Labs today.

## 2022-10-09 NOTE — Assessment & Plan Note (Signed)
Has been previously uncontrolled.  Lipid panel to assess.

## 2022-10-09 NOTE — Progress Notes (Signed)
Subjective:  Patient ID: [name], female    DOB: 1945-10-20  Age: 77 y.o. MRN: [id number]  CC: Chief Complaint  Patient presents with   severe leg cramps    Bilateral legs for years more severe in last 6 months - worse at night    shoulder and upper arm pain    Pain to put on a coat  Also pain in multiple joints and neuropathy in feet and loss of muscle mass in right leg    HPI:  77 year old female presents for evaluation of the above.  Patient reports ongoing nocturnal leg cramps.  She states that this has been going on fairly persistently for the past 6 months.  Primarily affects the calves and the feet.  No relieving factors.  She has had no relief with over-the-counter remedies.  Patient also reports bilateral shoulder pain.  This is chronic.  Has been going on for 3 to 4 years.  She reports pain and decreased range of motion.  She has never had any imaging or workup.  No medications or interventions tried.  Patient Active Problem List   Diagnosis Date Noted   Hyperlipidemia 10/09/2022   Nocturnal leg cramps 10/09/2022   Chronic pain of both shoulders 10/09/2022   Lichen sclerosus et atrophicus of the vulva 01/15/2022   Obese 11/06/2016   S/P right THA, AA 11/05/2016    Social Hx   Social History   Socioeconomic History   Marital status: Widowed    Spouse name: Not on file   Number of children: Not on file   Years of education: Not on file   Highest education level: Not on file  Occupational History   Not on file  Tobacco Use   Smoking status: Never   Smokeless tobacco: Never  Substance and Sexual Activity   Alcohol use: No   Drug use: No   Sexual activity: Not on file  Other Topics Concern   Not on file  Social History Narrative   Not on file   Social Determinants of Health   Financial Resource Strain: Low Risk  (09/27/2021)   Overall Financial Resource Strain (CARDIA)    Difficulty of Paying Living Expenses: Not hard at all  Food Insecurity: No  Food Insecurity (09/27/2021)   Hunger Vital Sign    Worried About Running Out of Food in the Last Year: Never true    Ran Out of Food in the Last Year: Never true  Transportation Needs: No Transportation Needs (09/27/2021)   PRAPARE - Administrator, Civil Service (Medical): No    Lack of Transportation (Non-Medical): No  Physical Activity: Insufficiently Active (09/27/2021)   Exercise Vital Sign    Days of Exercise per Week: 5 days    Minutes of Exercise per Session: 20 min  Stress: No Stress Concern Present (09/27/2021)   [employer] of Occupational Health - Occupational Stress Questionnaire    Feeling of Stress : Not at all  Social Connections: Unknown (09/27/2021)   Social Connection and Isolation Panel [NHANES]    Frequency of Communication with Friends and Family: More than three times a week    Frequency of Social Gatherings with Friends and Family: Once a week    Attends Religious Services: Patient declined    Database administrator or Organizations: No    Attends Banker Meetings: Never    Marital Status: Widowed    Review of Systems Per HPI  Objective:  BP 136/84  Pulse 89   Temp (!) 97.3 F (36.3 C)   Ht 5\' 3"  (1.6 m)   Wt 186 lb (84.4 kg)   SpO2 99%   BMI 32.95 kg/m      10/09/2022    3:27 PM 01/15/2022   10:22 AM 12/18/2021   10:48 AM  BP/Weight  Systolic BP 136 140 124  Diastolic BP 84 82 80  Wt. (Lbs) 186  185.6  BMI 32.95 kg/m2  32.88 kg/m2    Physical Exam Vitals and nursing note reviewed.  Constitutional:      Appearance: Normal appearance. She is obese.  HENT:     Head: Normocephalic and atraumatic.  Eyes:     General:        Right eye: No discharge.        Left eye: No discharge.     Conjunctiva/sclera: Conjunctivae normal.  Pulmonary:     Effort: Pulmonary effort is normal. No respiratory distress.  Musculoskeletal:     Comments: Shoulders with bilateral decreased range of motion.  Neurological:     Mental  Status: She is alert.  Psychiatric:        Mood and Affect: Mood normal.        Behavior: Behavior normal.     Lab Results  Component Value Date   WBC 7.8 09/10/2021   HGB 14.5 09/10/2021   HCT 42.1 09/10/2021   PLT 216 09/10/2021   GLUCOSE 90 10/22/2021   CHOL 268 (H) 09/10/2021   TRIG 205 (H) 09/10/2021   HDL 56 09/10/2021   LDLCALC 174 (H) 09/10/2021   ALT 18 10/22/2021   AST 20 10/22/2021   NA 139 10/22/2021   K 4.9 10/22/2021   CL 100 10/22/2021   CREATININE 0.94 10/22/2021   BUN 17 10/22/2021   CO2 24 10/22/2021   HGBA1C 5.3 09/10/2021     Assessment & Plan:   Problem List Items Addressed This Visit       Other   Chronic pain of both shoulders    X-rays today to assess for degenerative changes.        Relevant Medications   baclofen (LIORESAL) 10 MG tablet   Other Relevant Orders   DG Shoulder Left   DG Shoulder Right   Hyperlipidemia    Has been previously uncontrolled.  Lipid panel to assess.      Relevant Orders   Lipid panel   Nocturnal leg cramps - Primary    Advised stretching.  Advised to stay hydrated.  Baclofen as directed.  Labs today.      Relevant Orders   CMP14+EGFR   Magnesium   Other Visit Diagnoses     Screening for deficiency anemia       Relevant Orders   CBC       Meds ordered this encounter  Medications   baclofen (LIORESAL) 10 MG tablet    Sig: Take 1 tablet (10 mg total) by mouth at bedtime as needed for muscle spasms.    Dispense:  30 each    Refill:  0   Candela Krul DO [facility]

## 2022-10-09 NOTE — Patient Instructions (Signed)
Labs today.  Medication as directed.  Stretch. Stay hydrated.

## 2022-10-10 LAB — CMP14+EGFR
ALT: 30 IU/L (ref 0–32)
AST: 40 IU/L (ref 0–40)
Albumin: 4.4 g/dL (ref 3.8–4.8)
Alkaline Phosphatase: 68 IU/L (ref 44–121)
BUN: 11 mg/dL (ref 8–27)
Bilirubin Total: 0.5 mg/dL (ref 0.0–1.2)
CO2: 24 mmol/L (ref 20–29)
Calcium: 9.8 mg/dL (ref 8.7–10.3)
Chloride: 101 mmol/L (ref 96–106)
Globulin, Total: 2.8 g/dL (ref 1.5–4.5)
Glucose: 91 mg/dL (ref 70–99)
Potassium: 4.7 mmol/L (ref 3.5–5.2)
Sodium: 139 mmol/L (ref 134–144)
Total Protein: 7.2 g/dL (ref 6.0–8.5)

## 2022-10-10 LAB — CBC
Hematocrit: 41.1 % (ref 34.0–46.6)
MCH: 32.3 pg (ref 26.6–33.0)
MCHC: 33.1 g/dL (ref 31.5–35.7)
MCV: 98 fL — ABNORMAL HIGH (ref 79–97)
Platelets: 267 10*3/uL (ref 150–450)
RBC: 4.21 x10E6/uL (ref 3.77–5.28)
RDW: 12.2 % (ref 11.7–15.4)
WBC: 8.2 10*3/uL (ref 3.4–10.8)

## 2022-10-10 LAB — LIPID PANEL
Chol/HDL Ratio: 4.5 ratio — ABNORMAL HIGH (ref 0.0–4.4)
Cholesterol, Total: 236 mg/dL — ABNORMAL HIGH (ref 100–199)
LDL Chol Calc (NIH): 142 mg/dL — ABNORMAL HIGH (ref 0–99)
Triglycerides: 230 mg/dL — ABNORMAL HIGH (ref 0–149)
VLDL Cholesterol Cal: 41 mg/dL — ABNORMAL HIGH (ref 5–40)

## 2022-10-10 LAB — MAGNESIUM: Magnesium: 2 mg/dL (ref 1.6–2.3)

## 2022-10-11 ENCOUNTER — Other Ambulatory Visit (HOSPITAL_COMMUNITY): Payer: Self-pay | Admitting: Family Medicine

## 2022-10-11 DIAGNOSIS — Z1231 Encounter for screening mammogram for malignant neoplasm of breast: Secondary | ICD-10-CM

## 2022-10-12 ENCOUNTER — Other Ambulatory Visit: Payer: Self-pay | Admitting: Family Medicine

## 2022-10-12 MED ORDER — ROSUVASTATIN CALCIUM 20 MG PO TABS: 20.0000 mg | ORAL_TABLET | Freq: Every day | ORAL | 3 refills | Status: DC

## 2022-10-25 ENCOUNTER — Ambulatory Visit (HOSPITAL_COMMUNITY): Admission: RE | Admit: 2022-10-25 | Payer: Medicare HMO | Source: Ambulatory Visit

## 2022-10-25 ENCOUNTER — Ambulatory Visit (HOSPITAL_COMMUNITY)
Admission: RE | Admit: 2022-10-25 | Discharge: 2022-10-25 | Disposition: A | Discharge status: Home / Self Care | Payer: Medicare HMO | Source: Ambulatory Visit | Attending: Family Medicine | Admitting: Family Medicine

## 2022-10-25 DIAGNOSIS — G8929 Other chronic pain: Secondary | ICD-10-CM | POA: Insufficient documentation

## 2022-10-25 DIAGNOSIS — Z1231 Encounter for screening mammogram for malignant neoplasm of breast: Secondary | ICD-10-CM

## 2022-10-25 DIAGNOSIS — M25511 Pain in right shoulder: Secondary | ICD-10-CM | POA: Diagnosis present

## 2022-10-25 DIAGNOSIS — M25512 Pain in left shoulder: Secondary | ICD-10-CM | POA: Diagnosis present

## 2022-10-29 ENCOUNTER — Other Ambulatory Visit: Payer: Self-pay

## 2022-10-29 DIAGNOSIS — G8929 Other chronic pain: Secondary | ICD-10-CM

## 2022-11-19 ENCOUNTER — Encounter: Payer: Self-pay | Admitting: Orthopaedic Surgery

## 2022-11-19 ENCOUNTER — Ambulatory Visit (INDEPENDENT_AMBULATORY_CARE_PROVIDER_SITE_OTHER): Payer: Medicare HMO | Admitting: Orthopaedic Surgery

## 2022-11-19 VITALS — BP 143/83 | HR 76 | Ht 63.0 in | Wt 186.0 lb

## 2022-11-19 DIAGNOSIS — M25511 Pain in right shoulder: Secondary | ICD-10-CM

## 2022-11-19 DIAGNOSIS — M25512 Pain in left shoulder: Secondary | ICD-10-CM | POA: Diagnosis not present

## 2022-11-19 DIAGNOSIS — G8929 Other chronic pain: Secondary | ICD-10-CM

## 2022-11-19 MED ORDER — METHYLPREDNISOLONE ACETATE 40 MG/ML IJ SUSP
40.0000 mg | Freq: Once | INTRAMUSCULAR | Status: AC
  Administered 2022-11-19: 40 mg via INTRA_ARTICULAR

## 2022-11-19 NOTE — Progress Notes (Signed)
My shoulders hurt.  She has been seen by her primary care physician.  I have reviewed the notes and the X-rays that were done.  She has chronic pain of both shoulders.  She has no trauma. She has pain rolling on them at night and with any overhead work.  She has no swelling, no redness, no paresthesias.  Both shoulders have near full ROM with pain in the extremes.  NV intact.  No effusion is present.  I have independently reviewed and interpreted x-rays of this patient done at another site by another physician or qualified health professional. I have reviewed the notes from primary care, also.  Encounter Diagnosis  Name Primary?   Chronic pain of both shoulders Yes   PROCEDURE NOTE:  The patient request injection, verbal consent was obtained.  The right shoulder was prepped appropriately after time out was performed.   Sterile technique was observed and injection of 1 cc of DepoMedrol 40mg  with several cc's of plain xylocaine. Anesthesia was provided by ethyl chloride and a 20-gauge needle was used to inject the shoulder area. A posterior approach was used.  The injection was tolerated well.  A band aid dressing was applied.  The patient was advised to apply ice later today and tomorrow to the injection sight as needed.  PROCEDURE NOTE:  The patient request injection, verbal consent was obtained.  The left shoulder was prepped appropriately after time out was performed.   Sterile technique was observed and injection of 1 cc of DepoMedrol 40mg  with several cc's of plain xylocaine. Anesthesia was provided by ethyl chloride and a 20-gauge needle was used to inject the shoulder area. A posterior approach was used.  The injection was tolerated well.  A band aid dressing was applied.  The patient was advised to apply ice later today and tomorrow to the injection sight as needed.  Return in three weeks.  Call if any problem.  Precautions discussed.  Electronically Signed Darreld Mclean, MD 9/3/20242:05 PM

## 2022-12-10 ENCOUNTER — Ambulatory Visit (INDEPENDENT_AMBULATORY_CARE_PROVIDER_SITE_OTHER): Payer: Medicare HMO | Admitting: Orthopaedic Surgery

## 2022-12-10 ENCOUNTER — Encounter: Payer: Self-pay | Admitting: Orthopaedic Surgery

## 2022-12-10 VITALS — BP 94/72 | HR 98 | Ht 63.0 in | Wt 186.0 lb

## 2022-12-10 DIAGNOSIS — M25512 Pain in left shoulder: Secondary | ICD-10-CM | POA: Diagnosis not present

## 2022-12-10 DIAGNOSIS — G8929 Other chronic pain: Secondary | ICD-10-CM | POA: Diagnosis not present

## 2022-12-10 DIAGNOSIS — M25511 Pain in right shoulder: Secondary | ICD-10-CM | POA: Diagnosis not present

## 2022-12-10 NOTE — Progress Notes (Signed)
I am much better.  She has had good results from the injections of the shoulders last visit.  She has full ROM and no pain today.  NV intact. ROM neck is full.  Encounter Diagnosis  Name Primary?   Chronic pain of both shoulders Yes   I will see prn.  Call if any problem.  Precautions discussed.  Electronically Signed Darreld Mclean, MD 9/24/20249:34 AM

## 2023-01-20 ENCOUNTER — Ambulatory Visit: Payer: Medicare HMO | Admitting: Obstetrics & Gynecology

## 2023-01-20 ENCOUNTER — Encounter: Payer: Self-pay | Admitting: Obstetrics & Gynecology

## 2023-01-20 VITALS — BP 131/79 | HR 76 | Ht 63.0 in

## 2023-01-20 DIAGNOSIS — N904 Leukoplakia of vulva: Secondary | ICD-10-CM

## 2023-01-20 DIAGNOSIS — B372 Candidiasis of skin and nail: Secondary | ICD-10-CM | POA: Diagnosis not present

## 2023-01-20 MED ORDER — FLUOCINONIDE EMULSIFIED BASE 0.05 % EX CREA: 1.0000 | TOPICAL_CREAM | Freq: Two times a day (BID) | CUTANEOUS | 3 refills | Status: AC

## 2023-01-20 MED ORDER — NYSTATIN 100000 UNIT/GM EX POWD: 1.0000 | Freq: Three times a day (TID) | CUTANEOUS | 0 refills | Status: DC

## 2023-01-20 NOTE — Progress Notes (Signed)
Follow up appointment for results: LSA(per vulvar biopsy 09/26/2021)  Chief Complaint  Patient presents with   Follow-up    Blood pressure 131/79, pulse 76, height 5\' 3"  (1.6 m).  Pt reports symptoms well controlled, generally, on the Lidex E 0.05% 1-2 times per week Has occasional flare and she will use it a bit more frequently Overall pleased with her symptom management  Has an area of intertriginous yeast leg labio crural fold-->painted with GV Will Rx: nystatin powder She reports getting it under her abdomen at times as well  LSA is stable undchanged, again biopsy 09/2021 confirms diagnosis without dysplastic changes  MEDS ordered this encounter: Meds ordered this encounter  Medications   fluocinonide-emollient (LIDEX-E) 0.05 % cream    Sig: Apply 1 Application topically 2 (two) times daily.    Dispense:  45 g    Refill:  3   nystatin (MYCOSTATIN/NYSTOP) powder    Sig: Apply 1 Application topically 3 (three) times daily.    Dispense:  15 g    Refill:  0    Orders for this encounter: No orders of the defined types were placed in this encounter.   Impression + Management Plan   ICD-10-CM   1. Lichen sclerosus et atrophicus of the vulva  N90.4    Continue lidex E 0.05% 2 times per week, ^with flares    2. Candidiasis, intertriginous  B37.2    Rx: nystatin powder      Follow Up: Return in about 1 year (around 01/20/2024), or if symptoms worsen or fail to improve, for Follow up, with Dr Despina Hidden.     All questions were answered.  Past Medical History:  Diagnosis Date   Breast cancer ([facility])    Cancer ([facility]) 1991   Breast   Personal history of radiation therapy     Past Surgical History:  Procedure Laterality Date   BREAST LUMPECTOMY Left    FOOT SURGERY  2006   JOINT REPLACEMENT     TOTAL HIP ARTHROPLASTY Right 11/05/2016   Procedure: RIGHT TOTAL HIP ARTHROPLASTY ANTERIOR APPROACH;  Surgeon: Durene Romans, MD;  Location: [facility];  Service: Orthopedics;   Laterality: Right;    OB History   No obstetric history on file.     No Known Allergies  Social History   Socioeconomic History   Marital status: Widowed    Spouse name: Not on file   Number of children: Not on file   Years of education: Not on file   Highest education level: Not on file  Occupational History   Not on file  Tobacco Use   Smoking status: Never   Smokeless tobacco: Never  Substance and Sexual Activity   Alcohol use: No   Drug use: No   Sexual activity: Not on file  Other Topics Concern   Not on file  Social History Narrative   Not on file   Social Determinants of Health   Financial Resource Strain: Low Risk  (09/27/2021)   Overall Financial Resource Strain (CARDIA)    Difficulty of Paying Living Expenses: Not hard at all  Food Insecurity: No Food Insecurity (09/27/2021)   Hunger Vital Sign    Worried About Running Out of Food in the Last Year: Never true    Ran Out of Food in the Last Year: Never true  Transportation Needs: No Transportation Needs (09/27/2021)   PRAPARE - Administrator, Civil Service (Medical): No    Lack of Transportation (Non-Medical): No  Physical Activity:  Insufficiently Active (09/27/2021)   Exercise Vital Sign    Days of Exercise per Week: 5 days    Minutes of Exercise per Session: 20 min  Stress: No Stress Concern Present (09/27/2021)   [employer] of Occupational Health - Occupational Stress Questionnaire    Feeling of Stress : Not at all  Social Connections: Unknown (09/27/2021)   Social Connection and Isolation Panel [NHANES]    Frequency of Communication with Friends and Family: More than three times a week    Frequency of Social Gatherings with Friends and Family: Once a week    Attends Religious Services: Patient declined    Database administrator or Organizations: No    Attends Banker Meetings: Never    Marital Status: Widowed    History reviewed. No pertinent family history.

## 2023-04-18 ENCOUNTER — Ambulatory Visit (INDEPENDENT_AMBULATORY_CARE_PROVIDER_SITE_OTHER): Payer: Medicare HMO

## 2023-04-18 VITALS — BP 131/79 | Ht 64.0 in | Wt 190.0 lb

## 2023-04-18 DIAGNOSIS — Z5320 Procedure and treatment not carried out because of patient's decision for unspecified reasons: Secondary | ICD-10-CM | POA: Diagnosis not present

## 2023-04-18 DIAGNOSIS — Z0000 Encounter for general adult medical examination without abnormal findings: Secondary | ICD-10-CM

## 2023-04-18 DIAGNOSIS — Z2821 Immunization not carried out because of patient refusal: Secondary | ICD-10-CM

## 2023-04-18 NOTE — Progress Notes (Signed)
Because this visit was a virtual/telehealth visit,  certain criteria was not obtained, such a blood pressure, CBG if applicable, and timed get up and go. Any medications not marked as "taking" were not mentioned during the medication reconciliation part of the visit. Any vitals not documented were not able to be obtained due to this being a telehealth visit or patient was unable to self-report a recent blood pressure reading due to a lack of equipment at home via telehealth. Vitals that have been documented are verbally provided by the patient.  Interactive audio and video telecommunications were attempted between this provider and patient, however failed, due to patient having technical difficulties OR patient did not have access to video capability.  We continued and completed visit with audio only.  Subjective:   [name] is a 78 y.o. female who presents for Medicare Annual (Subsequent) preventive examination.  Visit Complete: Virtual I connected with  [name] on 04/18/23 by a audio enabled telemedicine application and verified that I am speaking with the correct person using two identifiers.  Patient Location: Home  Provider Location: Home Office  I discussed the limitations of evaluation and management by telemedicine. The patient expressed understanding and agreed to proceed.  Vital Signs: Because this visit was a virtual/telehealth visit, some criteria may be missing or patient reported. Any vitals not documented were not able to be obtained and vitals that have been documented are patient reported.  Patient Medicare AWV questionnaire was completed by the patient on na; I have confirmed that all information answered by patient is correct and no changes since this date.  Cardiac Risk Factors include: advanced age (>64men, >55 women);obesity (BMI >30kg/m2)     Objective:    Today's Vitals   04/18/23 1000  BP: 131/79  Weight: 190 lb (86.2 kg)  Height: 5\' 4"  (1.626 m)    Body mass index is 32.61 kg/m.     11/05/2016    3:53 PM 10/29/2016   11:26 AM  Advanced Directives  Does Patient Have a Medical Advance Directive? Yes Yes  Type of Estate agent of [place];Living will Healthcare Power of [place];Living will  Does patient want to make changes to medical advance directive? No - Patient declined No - Patient declined  Copy of Healthcare Power of Attorney in Chart? No - copy requested No - copy requested    Current Medications (verified) Outpatient Encounter Medications as of 04/18/2023  Medication Sig   BIOTIN PO Take 1 Dose by mouth daily. Takes Biotin Gummies   Cholecalciferol (VITAMIN D3) 2000 units TABS Take 2,000 Units by mouth daily.    fluocinonide-emollient (LIDEX-E) 0.05 % cream Apply 1 Application topically 2 (two) times daily.   Krill Oil 300 MG CAPS Take 300 mg by mouth daily.    Multiple Vitamin (MULTIVITAMIN) tablet Take 1 tablet by mouth daily.   nystatin (MYCOSTATIN/NYSTOP) powder Apply 1 Application topically 3 (three) times daily.   rosuvastatin (CRESTOR) 20 MG tablet Take 1 tablet (20 mg total) by mouth daily.   vitamin B-12 (CYANOCOBALAMIN) 1000 MCG tablet Take 1,000 mcg by mouth daily.   baclofen (LIORESAL) 10 MG tablet Take 1 tablet (10 mg total) by mouth at bedtime as needed for muscle spasms. (Patient not taking: Reported on 04/18/2023)   diphenhydrAMINE (BENADRYL) 25 mg capsule Take 50 mg by mouth at bedtime. (Patient not taking: Reported on 04/18/2023)   No facility-administered encounter medications on file as of 04/18/2023.    Allergies (verified) Patient has no  known allergies.   History: Past Medical History:  Diagnosis Date   Breast cancer (HCC)    Cancer (HCC) 1991   Breast   Personal history of radiation therapy    Past Surgical History:  Procedure Laterality Date   BREAST LUMPECTOMY Left    FOOT SURGERY  2006   JOINT REPLACEMENT     TOTAL HIP ARTHROPLASTY Right 11/05/2016   Procedure:  RIGHT TOTAL HIP ARTHROPLASTY ANTERIOR APPROACH;  Surgeon: Durene Romans, MD;  Location: [facility];  Service: Orthopedics;  Laterality: Right;   History reviewed. No pertinent family history. Social History   Socioeconomic History   Marital status: Widowed    Spouse name: Not on file   Number of children: Not on file   Years of education: Not on file   Highest education level: Not on file  Occupational History   Not on file  Tobacco Use   Smoking status: Never   Smokeless tobacco: Never  Substance and Sexual Activity   Alcohol use: No   Drug use: No   Sexual activity: Not on file  Other Topics Concern   Not on file  Social History Narrative   Not on file   Social Drivers of Health   Financial Resource Strain: Low Risk  (04/18/2023)   Overall Financial Resource Strain (CARDIA)    Difficulty of Paying Living Expenses: Not hard at all  Food Insecurity: No Food Insecurity (04/18/2023)   Hunger Vital Sign    Worried About Running Out of Food in the Last Year: Never true    Ran Out of Food in the Last Year: Never true  Transportation Needs: No Transportation Needs (04/18/2023)   PRAPARE - Administrator, Civil Service (Medical): No    Lack of Transportation (Non-Medical): No  Physical Activity: Sufficiently Active (04/18/2023)   Exercise Vital Sign    Days of Exercise per Week: 7 days    Minutes of Exercise per Session: 30 min  Stress: No Stress Concern Present (04/18/2023)   [employer] of Occupational Health - Occupational Stress Questionnaire    Feeling of Stress : Not at all  Social Connections: Moderately Isolated (04/18/2023)   Social Connection and Isolation Panel [NHANES]    Frequency of Communication with Friends and Family: More than three times a week    Frequency of Social Gatherings with Friends and Family: More than three times a week    Attends Religious Services: More than 4 times per year    Active Member of [employer] or Organizations: No    Attends  Banker Meetings: Never    Marital Status: Widowed    Tobacco Counseling Counseling given: Not Answered   Clinical Intake:  Pre-visit preparation completed: Yes  Pain : No/denies pain     BMI - recorded: 32.61 Nutritional Status: BMI > 30  Obese Nutritional Risks: None Diabetes: No  How often do you need to have someone help you when you read instructions, pamphlets, or other written materials from your doctor or pharmacy?: 1 - Never  Interpreter Needed?: No  Information entered by :: Maryjean Ka CMA   Activities of Daily Living    04/18/2023   10:44 AM  In your present state of health, do you have any difficulty performing the following activities:  Freeman? 0  Vision? 0  Difficulty concentrating or making decisions? 0  Walking or climbing stairs? 0  Dressing or bathing? 0  Doing errands, shopping? 0  Preparing Food and eating ? N  Using the Toilet? N  In the past six months, have you accidently leaked urine? N  Do you have problems with loss of bowel control? N  Managing your Medications? N  Managing your Finances? N  Housekeeping or managing your Housekeeping? N    Patient Care Team: Tommie Sams, DO as PCP - General (Family Medicine) Lazaro Arms, MD as Consulting Physician (Obstetrics and Gynecology) Darreld Mclean, MD as Attending Physician (Orthopedic Surgery)  Indicate any recent Medical Services you may have received from other than [facility] providers in the past year (date may be approximate).     Assessment:   This is a routine wellness examination for [name].  Freeman/Vision screen Freeman Screening - Comments:: Patient denies any Freeman difficulties.   Vision Screening - Comments:: Wears reading glasses. Has had cataract surgery. Previous eye doctor was Dr. Nile Riggs who has since retired. She states she will go to [facility] in [place] for eye care. She has seen them in the past.    Goals Addressed             This Visit's  Progress    Patient Stated       Replace all of the carpet in my house.        Depression Screen    04/18/2023   10:45 AM 10/09/2022    3:30 PM 12/18/2021   10:50 AM 10/18/2021    1:21 PM 09/27/2021   11:27 AM 09/10/2021    1:10 PM  PHQ 2/9 Scores  PHQ - 2 Score 0 0 0 0 0 0  PHQ- 9 Score 0 3   0     Fall Risk    04/18/2023   10:44 AM 10/09/2022    3:30 PM 12/18/2021   10:49 AM 10/18/2021    1:21 PM 09/10/2021    1:10 PM  Fall Risk   Falls in the past year? 0 0 0 0 0  Number falls in past yr: 0 0  0 0  Injury with Fall? 0 0  0 0  Risk for fall due to : No Fall Risks No Fall Risks No Fall Risks No Fall Risks No Fall Risks  Follow up Falls prevention discussed Falls evaluation completed Falls evaluation completed Falls evaluation completed Falls evaluation completed    MEDICARE RISK AT HOME: Medicare Risk at Home Any stairs in or around the home?: Yes If so, are there any without handrails?: No Home free of loose throw rugs in walkways, pet beds, electrical cords, etc?: Yes Adequate lighting in your home to reduce risk of falls?: Yes Life alert?: No Use of a cane, walker or w/c?: No Grab bars in the bathroom?: No Shower chair or bench in shower?: Yes Elevated toilet seat or a handicapped toilet?: No  TIMED UP AND GO:  Was the test performed?  No    Cognitive Function:        04/18/2023   10:41 AM  6CIT Screen  What Year? 0 points  What month? 0 points  What time? 0 points  Count back from 20 0 points  Months in reverse 0 points  Repeat phrase 0 points  Total Score 0 points    Immunizations Immunization History  Administered Date(s) Administered   Pneumococcal-Unspecified 12/11/2012    TDAP Status: Patient declined vaccine Due, Education has been provided regarding the importance of this vaccine. Advised may receive this vaccine at local pharmacy or Health Dept. Aware to provide a copy of the vaccination record if obtained  from local pharmacy or Health Dept.  Verbalized acceptance and understanding.   Flu Vaccine status: Declined, Education has been provided regarding the importance of this vaccine but patient still declined. Advised may receive this vaccine at local pharmacy or Health Dept. Aware to provide a copy of the vaccination record if obtained from local pharmacy or Health Dept. Verbalized acceptance and understanding.  Pneumococcal vaccine status: Declined,  Education has been provided regarding the importance of this vaccine but patient still declined. Advised may receive this vaccine at local pharmacy or Health Dept. Aware to provide a copy of the vaccination record if obtained from local pharmacy or Health Dept. Verbalized acceptance and understanding.   Covid-19 vaccine status: Declined, Education has been provided regarding the importance of this vaccine but patient still declined. Advised may receive this vaccine at local pharmacy or Health Dept.or vaccine clinic. Aware to provide a copy of the vaccination record if obtained from local pharmacy or Health Dept. Verbalized acceptance and understanding.  Qualifies for Shingles Vaccine? Yes   Shingrix Vaccine: Patient declined vaccine.  Due, Education has been provided regarding the importance of this vaccine. Advised may receive this vaccine at local pharmacy or Health Dept. Aware to provide a copy of the vaccination record if obtained from local pharmacy or Health Dept. Verbalized acceptance and understanding.  Screening Tests Health Maintenance  Topic Date Due   Hepatitis C Screening  Never done   DTaP/Tdap/Td (1 - Tdap) Never done   Zoster Vaccines- Shingrix (1 of 2) Never done   Pneumonia Vaccine 62+ Years old (1 of 1 - PCV) 05/21/2009   INFLUENZA VACCINE  Never done   COVID-19 Vaccine (1 - 2024-25 season) Never done   MAMMOGRAM  10/25/2023   Medicare Annual Wellness (AWV)  04/17/2024   DEXA SCAN  Completed   HPV VACCINES  Aged Out    Health Maintenance  Health Maintenance  Due  Topic Date Due   Hepatitis C Screening  Never done   DTaP/Tdap/Td (1 - Tdap) Never done   Zoster Vaccines- Shingrix (1 of 2) Never done   Pneumonia Vaccine 41+ Years old (1 of 1 - PCV) 05/21/2009   INFLUENZA VACCINE  Never done   COVID-19 Vaccine (1 - 2024-25 season) Never done    Colorectal cancer screening: No longer required.   Mammogram status: Completed 11/14/2022. Repeat every year  Bone Density Status:Patient declined bone density screening  Lung Cancer Screening: (Low Dose CT Chest recommended if Age 55-80 years, 20 pack-year currently smoking OR have quit w/in 15years.) does not qualify.   Lung Cancer Screening Referral: na  Additional Screening:  Hepatitis C Screening: does qualify; Patient declined Hep C Screening   Vision Screening: Recommended annual ophthalmology exams for early detection of glaucoma and other disorders of the eye. Is the patient up to date with their annual eye exam?  Yes  Who is the provider or what is the name of the office in which the patient attends annual eye exams? Jethro Bolus who has retired since patient's visit. She will go back to [facility] for exams. Declined referral today If pt is not established with a provider, would they like to be referred to a provider to establish care? No .   Dental Screening: Recommended annual dental exams for proper oral hygiene  Diabetic Foot Exam: na  Community Resource Referral / Chronic Care Management: CRR required this visit?  No   CCM required this visit?  No     Plan:  I have personally reviewed and noted the following in the patient's chart:   Medical and social history Use of alcohol, tobacco or illicit drugs  Current medications and supplements including opioid prescriptions. Patient is not currently taking opioid prescriptions. Functional ability and status Nutritional status Physical activity Advanced directives List of other physicians Hospitalizations,  surgeries, and ER visits in previous 12 months Vitals Screenings to include cognitive, depression, and falls Referrals and appointments  In addition, I have reviewed and discussed with patient certain preventive protocols, quality metrics, and best practice recommendations. A written personalized care plan for preventive services as well as general preventive health recommendations were provided to patient.     Jordan Hawks [name], CMA   04/18/2023   After Visit Summary: (Mail) Due to this being a telephonic visit, the after visit summary with patients personalized plan was offered to patient via mail   Nurse Notes: see routing comment

## 2023-04-18 NOTE — Patient Instructions (Signed)
Ms. Etcheverry , Thank you for taking time to come for your Medicare Wellness Visit. I appreciate your ongoing commitment to your health goals. Please review the following plan we discussed and let me know if I can assist you in the future.   Referrals/Orders/Follow-Ups/Clinician Recommendations:  Next Medicare Annual Wellness Visit: April 23, 2024 at 3:00 pm telephone visit.      This is a list of the screening recommended for you and due dates:  Health Maintenance  Topic Date Due   Hepatitis C Screening  Never done   DTaP/Tdap/Td vaccine (1 - Tdap) Never done   Zoster (Shingles) Vaccine (1 of 2) Never done   Pneumonia Vaccine (1 of 1 - PCV) 05/21/2009   Flu Shot  Never done   COVID-19 Vaccine (1 - 2024-25 season) Never done   Mammogram  10/25/2023   Medicare Annual Wellness Visit  04/17/2024   DEXA scan (bone density measurement)  Completed   HPV Vaccine  Aged Out    Advanced directives: (ACP Link)Information on Advanced Care Planning can be found at [facility] [place] Advance Health Care Directives Advance Health Care Directives ([email or web address]   Next Medicare Annual Wellness Visit scheduled for next year: yes  Preventive Care 65 Years and Older, Female Preventive care refers to lifestyle choices and visits with your health care provider that can promote health and wellness. Preventive care visits are also called wellness exams. What can I expect for my preventive care visit? Counseling Your health care provider may ask you questions about your: Medical history, including: Past medical problems. Family medical history. Pregnancy and menstrual history. History of falls. Current health, including: Memory and ability to understand (cognition). Emotional well-being. Home life and relationship well-being. Sexual activity and sexual health. Lifestyle, including: Alcohol, nicotine or tobacco, and drug use. Access to firearms. Diet, exercise, and sleep habits. Work  and work Astronomer. Sunscreen use. Safety issues such as seatbelt and bike helmet use. Physical exam Your health care provider will check your: Height and weight. These may be used to calculate your BMI (body mass index). BMI is a measurement that tells if you are at a healthy weight. Waist circumference. This measures the distance around your waistline. This measurement also tells if you are at a healthy weight and may help predict your risk of certain diseases, such as type 2 diabetes and high blood pressure. Heart rate and blood pressure. Body temperature. Skin for abnormal spots. What immunizations do I need?  Vaccines are usually given at various ages, according to a schedule. Your health care provider will recommend vaccines for you based on your age, medical history, and lifestyle or other factors, such as travel or where you work. What tests do I need? Screening Your health care provider may recommend screening tests for certain conditions. This may include: Lipid and cholesterol levels. Hepatitis C test. Hepatitis B test. HIV (human immunodeficiency virus) test. STI (sexually transmitted infection) testing, if you are at risk. Lung cancer screening. Colorectal cancer screening. Diabetes screening. This is done by checking your blood sugar (glucose) after you have not eaten for a while (fasting). Mammogram. Talk with your health care provider about how often you should have regular mammograms. BRCA-related cancer screening. This may be done if you have a family history of breast, ovarian, tubal, or peritoneal cancers. Bone density scan. This is done to screen for osteoporosis. Talk with your health care provider about your test results, treatment options, and if necessary, the need for more tests.  Follow these instructions at home: Eating and drinking  Eat a diet that includes fresh fruits and vegetables, whole grains, lean protein, and low-fat dairy products. Limit your  intake of foods with high amounts of sugar, saturated fats, and salt. Take vitamin and mineral supplements as recommended by your health care provider. Do not drink alcohol if your health care provider tells you not to drink. If you drink alcohol: Limit how much you have to 0-1 drink a day. Know how much alcohol is in your drink. In the [place] one drink equals one 12 oz bottle of beer (355 mL), one 5 oz glass of wine (148 mL), or one 1 oz glass of hard liquor (44 mL). Lifestyle Brush your teeth every morning and night with fluoride toothpaste. Floss one time each day. Exercise for at least 30 minutes 5 or more days each week. Do not use any products that contain nicotine or tobacco. These products include cigarettes, chewing tobacco, and vaping devices, such as e-cigarettes. If you need help quitting, ask your health care provider. Do not use drugs. If you are sexually active, practice safe sex. Use a condom or other form of protection in order to prevent STIs. Take aspirin only as told by your health care provider. Make sure that you understand how much to take and what form to take. Work with your health care provider to find out whether it is safe and beneficial for you to take aspirin daily. Ask your health care provider if you need to take a cholesterol-lowering medicine (statin). Find healthy ways to manage stress, such as: Meditation, yoga, or listening to music. Journaling. Talking to a trusted person. Spending time with friends and family. Minimize exposure to UV radiation to reduce your risk of skin cancer. Safety Always wear your seat belt while driving or riding in a vehicle. Do not drive: If you have been drinking alcohol. Do not ride with someone who has been drinking. When you are tired or distracted. While texting. If you have been using any mind-altering substances or drugs. Wear a helmet and other protective equipment during sports activities. If you have firearms in  your house, make sure you follow all gun safety procedures. What's next? Visit your health care provider once a year for an annual wellness visit. Ask your health care provider how often you should have your eyes and teeth checked. Stay up to date on all vaccines. This information is not intended to replace advice given to you by your health care provider. Make sure you discuss any questions you have with your health care provider. Document Revised: 08/30/2020 Document Reviewed: 08/30/2020 Elsevier Patient Education  2024 [employer].  Understanding Your Risk for Falls Millions of people have serious injuries from falls each year. It is important to understand your risk of falling. Talk with your health care provider about your risk and what you can do to lower it. If you do have a serious fall, make sure to tell your provider. Falling once raises your risk of falling again. How can falls affect me? Serious injuries from falls are common. These include: Broken bones, such as hip fractures. Head injuries, such as traumatic brain injuries (TBI) or concussions. A fear of falling can cause you to avoid activities and stay at home. This can make your muscles weaker and raise your risk for a fall. What can increase my risk? There are a number of risk factors that increase your risk for falling. The more risk factors you have,  the higher your risk of falling. Serious injuries from a fall happen most often to people who are older than 79 years old. Teenagers and young adults ages 22-29 are also at higher risk. Common risk factors include: Weakness in the lower body. Being generally weak or confused due to long-term (chronic) illness. Dizziness or balance problems. Poor vision. Medicines that cause dizziness or drowsiness. These may include: Medicines for your blood pressure, heart, anxiety, insomnia, or swelling (edema). Pain medicines. Muscle relaxants. Other risk factors include: Drinking  alcohol. Having had a fall in the past. Having foot pain or wearing improper footwear. Working at a dangerous job. Having any of the following in your home: Tripping hazards, such as floor clutter or loose rugs. Poor lighting. Pets. Having dementia or memory loss. What actions can I take to lower my risk of falling?     Physical activity Stay physically fit. Do strength and balance exercises. Consider taking a regular class to build strength and balance. Yoga and tai chi are good options. Vision Have your eyes checked every year and your prescription for glasses or contacts updated as needed. Shoes and walking aids Wear non-skid shoes. Wear shoes that have rubber soles and low heels. Do not wear high heels. Do not walk around the house in socks or slippers. Use a cane or walker as told by your provider. Home safety Attach secure railings on both sides of your stairs. Install grab bars for your bathtub, shower, and toilet. Use a non-skid mat in your bathtub or shower. Attach bath mats securely with double-sided, non-slip rug tape. Use good lighting in all rooms. Keep a flashlight near your bed. Make sure there is a clear path from your bed to the bathroom. Use night-lights. Do not use throw rugs. Make sure all carpeting is taped or tacked down securely. Remove all clutter from walkways and stairways, including extension cords. Repair uneven or broken steps and floors. Avoid walking on icy or slippery surfaces. Walk on the grass instead of on icy or slick sidewalks. Use ice melter to get rid of ice on walkways in the winter. Use a cordless phone. Questions to ask your health care provider Can you help me check my risk for a fall? Do any of my medicines make me more likely to fall? Should I take a vitamin D supplement? What exercises can I do to improve my strength and balance? Should I make an appointment to have my vision checked? Do I need a bone density test to check for weak  bones (osteoporosis)? Would it help to use a cane or a walker? Where to find more information Centers for Disease Control and Prevention, STEADI: TonerPromos.no Community-Based Fall Prevention Programs: TonerPromos.no [employer] on Aging: BaseRingTones.pl Contact a health care provider if: You fall at home. You are afraid of falling at home. You feel weak, drowsy, or dizzy. This information is not intended to replace advice given to you by your health care provider. Make sure you discuss any questions you have with your health care provider. Document Revised: 11/05/2021 Document Reviewed: 11/05/2021 Elsevier Patient Education  2024 [employer].

## 2023-09-23 ENCOUNTER — Other Ambulatory Visit (HOSPITAL_COMMUNITY): Payer: Self-pay | Admitting: Family Medicine

## 2023-09-23 DIAGNOSIS — Z1231 Encounter for screening mammogram for malignant neoplasm of breast: Secondary | ICD-10-CM

## 2023-10-02 ENCOUNTER — Other Ambulatory Visit: Payer: Self-pay | Admitting: Family Medicine

## 2023-10-30 ENCOUNTER — Other Ambulatory Visit: Payer: Self-pay | Admitting: Family Medicine

## 2023-10-31 ENCOUNTER — Other Ambulatory Visit: Payer: Self-pay

## 2023-10-31 ENCOUNTER — Ambulatory Visit (HOSPITAL_COMMUNITY)
Admission: RE | Admit: 2023-10-31 | Discharge: 2023-10-31 | Disposition: A | Discharge status: Home / Self Care | Source: Ambulatory Visit | Attending: Family Medicine | Admitting: Family Medicine

## 2023-10-31 DIAGNOSIS — Z1231 Encounter for screening mammogram for malignant neoplasm of breast: Secondary | ICD-10-CM | POA: Insufficient documentation

## 2023-10-31 MED ORDER — ROSUVASTATIN CALCIUM 20 MG PO TABS: 20.0000 mg | ORAL_TABLET | Freq: Every day | ORAL | 1 refills | Status: DC

## 2023-11-14 ENCOUNTER — Ambulatory Visit (INDEPENDENT_AMBULATORY_CARE_PROVIDER_SITE_OTHER): Admitting: Family Medicine

## 2023-11-14 VITALS — BP 124/76 | HR 82 | Temp 97.0°F | Ht 64.0 in | Wt 185.0 lb

## 2023-11-14 DIAGNOSIS — R350 Frequency of micturition: Secondary | ICD-10-CM | POA: Diagnosis not present

## 2023-11-14 DIAGNOSIS — E785 Hyperlipidemia, unspecified: Secondary | ICD-10-CM | POA: Diagnosis not present

## 2023-11-14 LAB — POCT URINALYSIS DIP (CLINITEK)
Bilirubin, UA: NEGATIVE
Blood, UA: NEGATIVE
Glucose, UA: NEGATIVE mg/dL
Ketones, POC UA: NEGATIVE mg/dL
Nitrite, UA: NEGATIVE
POC PROTEIN,UA: 30 — AB
Spec Grav, UA: 1.015 (ref 1.010–1.025)
Urobilinogen, UA: 0.2 U/dL
pH, UA: 5 (ref 5.0–8.0)

## 2023-11-14 MED ORDER — ROSUVASTATIN CALCIUM 20 MG PO TABS: 20.0000 mg | ORAL_TABLET | Freq: Every day | ORAL | 3 refills | Status: AC

## 2023-11-14 MED ORDER — CEPHALEXIN 500 MG PO CAPS: 500.0000 mg | ORAL_CAPSULE | Freq: Two times a day (BID) | ORAL | 0 refills | Status: AC

## 2023-11-14 NOTE — Assessment & Plan Note (Signed)
 UA with pyuria.  Sending culture.  Placed on Keflex .

## 2023-11-14 NOTE — Progress Notes (Signed)
 Subjective:  Patient ID: [name], female    DOB: 14-Jul-1945  Age: 78 y.o. MRN: [id number]  CC:   Chief Complaint  Patient presents with   Urinary Frequency    Funny feeling w/ urination    HPI:  78 year old female presents for evaluation of the above.  1 week history of symptoms.  Reports an irritating feeling when she urinates.  Also reports urinary frequency.  She is concerned for UTI.  No abdominal pain.  No fever.  Patient also states that she needs refill on her cholesterol medication.  Patient Active Problem List   Diagnosis Date Noted   Urinary frequency 11/14/2023   Hyperlipidemia 10/09/2022   Nocturnal leg cramps 10/09/2022   Chronic pain of both shoulders 10/09/2022   Lichen sclerosus et atrophicus of the vulva 01/15/2022   Obese 11/06/2016   S/P right THA, AA 11/05/2016    Social Hx   Social History   Socioeconomic History   Marital status: Widowed    Spouse name: Not on file   Number of children: Not on file   Years of education: Not on file   Highest education level: Not on file  Occupational History   Not on file  Tobacco Use   Smoking status: Never   Smokeless tobacco: Never  Substance and Sexual Activity   Alcohol use: No   Drug use: No   Sexual activity: Not on file  Other Topics Concern   Not on file  Social History Narrative   Not on file   Social Drivers of Health   Financial Resource Strain: Low Risk  (04/18/2023)   Overall Financial Resource Strain (CARDIA)    Difficulty of Paying Living Expenses: Not hard at all  Food Insecurity: No Food Insecurity (04/18/2023)   Hunger Vital Sign    Worried About Running Out of Food in the Last Year: Never true    Ran Out of Food in the Last Year: Never true  Transportation Needs: No Transportation Needs (04/18/2023)   PRAPARE - Administrator, Civil Service (Medical): No    Lack of Transportation (Non-Medical): No  Physical Activity: Sufficiently Active (04/18/2023)   Exercise  Vital Sign    Days of Exercise per Week: 7 days    Minutes of Exercise per Session: 30 min  Stress: No Stress Concern Present (04/18/2023)   [employer] of Occupational Health - Occupational Stress Questionnaire    Feeling of Stress : Not at all  Social Connections: Moderately Isolated (04/18/2023)   Social Connection and Isolation Panel    Frequency of Communication with Friends and Family: More than three times a week    Frequency of Social Gatherings with Friends and Family: More than three times a week    Attends Religious Services: More than 4 times per year    Active Member of [employer] or Organizations: No    Attends Banker Meetings: Never    Marital Status: Widowed    Review of Systems Per HPI  Objective:  BP 124/76   Pulse 82   Temp (!) 97 F (36.1 C)   Ht 5' 4 (1.626 m)   Wt 185 lb (83.9 kg)   SpO2 97%   BMI 31.76 kg/m      11/14/2023    8:21 AM 04/18/2023   10:00 AM 01/20/2023    9:42 AM  BP/Weight  Systolic BP 124 131 131  Diastolic BP 76 79 79  Wt. (Lbs) 185 190   BMI  31.76 kg/m2 32.61 kg/m2     Physical Exam Vitals and nursing note reviewed.  Constitutional:      General: She is not in acute distress.    Appearance: Normal appearance.  HENT:     Head: Normocephalic and atraumatic.  Cardiovascular:     Rate and Rhythm: Normal rate and regular rhythm.  Pulmonary:     Effort: Pulmonary effort is normal.     Breath sounds: Normal breath sounds.  Abdominal:     General: There is no distension.     Palpations: Abdomen is soft.     Tenderness: There is no abdominal tenderness.  Neurological:     Mental Status: She is alert.     Lab Results  Component Value Date   WBC 8.2 10/09/2022   HGB 13.6 10/09/2022   HCT 41.1 10/09/2022   PLT 267 10/09/2022   GLUCOSE 91 10/09/2022   CHOL 236 (H) 10/09/2022   TRIG 230 (H) 10/09/2022   HDL 53 10/09/2022   LDLCALC 142 (H) 10/09/2022   ALT 30 10/09/2022   AST 40 10/09/2022   NA 139  10/09/2022   K 4.7 10/09/2022   CL 101 10/09/2022   CREATININE 0.86 10/09/2022   BUN 11 10/09/2022   CO2 24 10/09/2022   HGBA1C 5.3 09/10/2021     Assessment & Plan:  Urinary frequency Assessment & Plan: UA with pyuria.  Sending culture.  Placed on Keflex .  Orders: -     POCT URINALYSIS DIP (CLINITEK) -     Urine Culture -     Cephalexin ; Take 1 capsule (500 mg total) by mouth 2 (two) times daily.  Dispense: 14 capsule; Refill: 0  Hyperlipidemia, unspecified hyperlipidemia type -     Rosuvastatin  Calcium ; Take 1 tablet (20 mg total) by mouth daily.  Dispense: 90 tablet; Refill: 3    Jayvin Hurrell DO [facility]

## 2023-11-14 NOTE — Patient Instructions (Signed)
 Antibiotic as prescribed.  Cholesterol medication is available at the pharmacy.  Take care  Dr. Bluford

## 2023-11-14 NOTE — Addendum Note (Signed)
 Addended by: BLUFORD JACQULYN MATSU on: 11/14/2023 08:39 AM   Modules accepted: Orders

## 2023-11-15 LAB — LIPID PANEL
Chol/HDL Ratio: 2.1 ratio (ref 0.0–4.4)
Cholesterol, Total: 124 mg/dL (ref 100–199)
HDL: 59 mg/dL (ref >39)
LDL Chol Calc (NIH): 41 mg/dL (ref 0–99)
Triglycerides: 139 mg/dL (ref 0–149)
VLDL Cholesterol Cal: 24 mg/dL (ref 5–40)

## 2023-11-17 ENCOUNTER — Ambulatory Visit: Payer: Self-pay | Admitting: Family Medicine

## 2023-11-18 ENCOUNTER — Other Ambulatory Visit: Payer: Self-pay | Admitting: Family Medicine

## 2023-11-18 LAB — SPECIMEN STATUS REPORT

## 2023-11-18 LAB — URINE CULTURE

## 2023-11-18 MED ORDER — SULFAMETHOXAZOLE-TRIMETHOPRIM 400-80 MG PO TABS: 1.0000 | ORAL_TABLET | Freq: Two times a day (BID) | ORAL | 0 refills | Status: AC

## 2024-04-23 ENCOUNTER — Ambulatory Visit: Payer: Medicare HMO

## 2024-04-23 NOTE — Patient Instructions (Incomplete)
 Ms. Spells,  Thank you for taking the time for your Medicare Wellness Visit. I appreciate your continued commitment to your health goals. Please review the care plan we discussed, and feel free to reach out if I can assist you further.  Please note that Annual Wellness Visits do not include a physical exam. Some assessments may be limited, especially if the visit was conducted virtually. If needed, we may recommend an in-person follow-up with your provider.  Ongoing Care Seeing your primary care provider every 3 to 6 months helps us  monitor your health and provide consistent, personalized care.   Referrals If a referral was made during today's visit and you haven't received any updates within two weeks, please contact the referred provider directly to check on the status.  Recommended Screenings:  Health Maintenance  Topic Date Due   Hepatitis C Screening  Never done   DTaP/Tdap/Td vaccine (1 - Tdap) Never done   Pneumococcal Vaccine for age over 6 (1 of 1 - PCV) 05/22/1994   Zoster (Shingles) Vaccine (1 of 2) Never done   Flu Shot  Never done   COVID-19 Vaccine (1 - 2025-26 season) Never done   Medicare Annual Wellness Visit  04/17/2024   Breast Cancer Screening  10/30/2024   Osteoporosis screening with Bone Density Scan  Completed   Meningitis B Vaccine  Aged Out       11/05/2016    3:53 PM  Advanced Directives  Does Patient Have a Medical Advance Directive? Yes   Type of Estate Agent of [place];Living will  Does patient want to make changes to medical advance directive? No - Patient declined   Copy of Healthcare Power of Attorney in Chart? No - copy requested      Data saved with a previous flowsheet row definition  Information on Advanced Care Planning can be found at [place]  Secretary of [facility] Advance Health Care Directives Advance Health Care Directives ([email or web address]   Vision: Annual vision screenings are recommended for early detection of  glaucoma, cataracts, and diabetic retinopathy. These exams can also reveal signs of chronic conditions such as diabetes and high blood pressure.  Dental: Annual dental screenings help detect early signs of oral cancer, gum disease, and other conditions linked to overall health, including heart disease and diabetes.  Please see the attached documents for additional preventive care recommendations.
